# Patient Record
Sex: Male | Born: 1945 | Race: White | Hispanic: No | Marital: Married | Smoking: Former smoker
Health system: Southern US, Community
[De-identification: ages and names within clinical notes are randomized; demographics above are authoritative.]

## PROBLEM LIST (undated history)

## (undated) DIAGNOSIS — R519 Headache, unspecified: Secondary | ICD-10-CM

## (undated) DIAGNOSIS — I219 Acute myocardial infarction, unspecified: Secondary | ICD-10-CM

## (undated) DIAGNOSIS — I10 Essential (primary) hypertension: Secondary | ICD-10-CM

## (undated) DIAGNOSIS — R42 Dizziness and giddiness: Secondary | ICD-10-CM

## (undated) DIAGNOSIS — F4310 Post-traumatic stress disorder, unspecified: Secondary | ICD-10-CM

## (undated) DIAGNOSIS — Z77098 Contact with and (suspected) exposure to other hazardous, chiefly nonmedicinal, chemicals: Secondary | ICD-10-CM

## (undated) DIAGNOSIS — R51 Headache: Secondary | ICD-10-CM

## (undated) DIAGNOSIS — E785 Hyperlipidemia, unspecified: Secondary | ICD-10-CM

## (undated) DIAGNOSIS — I2510 Atherosclerotic heart disease of native coronary artery without angina pectoris: Secondary | ICD-10-CM

## (undated) HISTORY — PX: BACK SURGERY: SHX140

## (undated) HISTORY — PX: PACEMAKER INSERTION: SHX728

## (undated) HISTORY — PX: KNEE SURGERY: SHX244

## (undated) HISTORY — PX: CAROTID STENT: SHX1301

---
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
## 2001-01-02 HISTORY — PX: BACK SURGERY: SHX140

## 2001-12-28 ENCOUNTER — Emergency Department (HOSPITAL_COMMUNITY): Admission: EM | Admit: 2001-12-28 | Discharge: 2001-12-29 | Payer: Self-pay | Admitting: Emergency Medicine

## 2002-02-27 ENCOUNTER — Encounter: Payer: Self-pay | Admitting: Emergency Medicine

## 2002-02-27 ENCOUNTER — Inpatient Hospital Stay (HOSPITAL_COMMUNITY): Admission: EM | Admit: 2002-02-27 | Discharge: 2002-03-04 | Payer: Self-pay | Admitting: Emergency Medicine

## 2002-03-25 ENCOUNTER — Inpatient Hospital Stay (HOSPITAL_COMMUNITY): Admission: AD | Admit: 2002-03-25 | Discharge: 2002-03-27 | Payer: Self-pay | Admitting: Internal Medicine

## 2003-08-09 ENCOUNTER — Encounter: Payer: Self-pay | Admitting: Emergency Medicine

## 2003-08-09 ENCOUNTER — Inpatient Hospital Stay (HOSPITAL_COMMUNITY): Admission: AD | Admit: 2003-08-09 | Discharge: 2003-08-11 | Payer: Self-pay | Admitting: Cardiology

## 2004-01-03 DIAGNOSIS — F1910 Other psychoactive substance abuse, uncomplicated: Secondary | ICD-10-CM

## 2004-01-03 HISTORY — DX: Other psychoactive substance abuse, uncomplicated: F19.10

## 2005-02-13 ENCOUNTER — Ambulatory Visit: Payer: Self-pay | Admitting: *Deleted

## 2006-02-06 ENCOUNTER — Emergency Department (HOSPITAL_COMMUNITY): Admission: EM | Admit: 2006-02-06 | Discharge: 2006-02-06 | Payer: Self-pay | Admitting: Emergency Medicine

## 2007-01-03 DIAGNOSIS — I219 Acute myocardial infarction, unspecified: Secondary | ICD-10-CM

## 2007-01-03 HISTORY — PX: CAROTID STENT: SHX1301

## 2007-01-03 HISTORY — DX: Acute myocardial infarction, unspecified: I21.9

## 2007-02-25 ENCOUNTER — Emergency Department (HOSPITAL_COMMUNITY): Admission: EM | Admit: 2007-02-25 | Discharge: 2007-02-25 | Payer: Self-pay | Admitting: Emergency Medicine

## 2007-02-28 ENCOUNTER — Ambulatory Visit: Payer: Self-pay | Admitting: Internal Medicine

## 2007-03-06 ENCOUNTER — Ambulatory Visit: Payer: Self-pay | Admitting: Cardiology

## 2007-03-06 ENCOUNTER — Encounter (HOSPITAL_COMMUNITY): Admission: RE | Admit: 2007-03-06 | Discharge: 2007-04-05 | Payer: Self-pay | Admitting: Internal Medicine

## 2007-03-08 ENCOUNTER — Ambulatory Visit: Payer: Self-pay | Admitting: Cardiology

## 2007-03-11 ENCOUNTER — Ambulatory Visit: Payer: Self-pay | Admitting: Internal Medicine

## 2007-04-08 ENCOUNTER — Ambulatory Visit (HOSPITAL_COMMUNITY): Admission: RE | Admit: 2007-04-08 | Discharge: 2007-04-08 | Payer: Self-pay | Admitting: Internal Medicine

## 2007-04-08 ENCOUNTER — Ambulatory Visit: Payer: Self-pay | Admitting: Internal Medicine

## 2008-07-02 ENCOUNTER — Emergency Department (HOSPITAL_COMMUNITY): Admission: EM | Admit: 2008-07-02 | Discharge: 2008-07-02 | Payer: Self-pay | Admitting: Emergency Medicine

## 2009-09-09 ENCOUNTER — Emergency Department (HOSPITAL_COMMUNITY): Admission: EM | Admit: 2009-09-09 | Discharge: 2009-09-09 | Payer: Self-pay | Admitting: Emergency Medicine

## 2010-01-02 HISTORY — PX: KNEE SURGERY: SHX244

## 2010-03-17 LAB — DIFFERENTIAL
Basophils Absolute: 0 10*3/uL (ref 0.0–0.1)
Basophils Relative: 1 % (ref 0–1)
Eosinophils Relative: 2 % (ref 0–5)
Monocytes Absolute: 0.6 10*3/uL (ref 0.1–1.0)
Monocytes Relative: 11 % (ref 3–12)
Neutro Abs: 3.1 10*3/uL (ref 1.7–7.7)

## 2010-03-17 LAB — POCT CARDIAC MARKERS
CKMB, poc: <1 ng/mL — ABNORMAL LOW (ref 1.0–8.0)
Troponin i, poc: <0.05 ng/mL (ref 0.00–0.09)

## 2010-03-17 LAB — CBC
HCT: 40.5 % (ref 39.0–52.0)
Hemoglobin: 13.8 g/dL (ref 13.0–17.0)
MCHC: 34.1 g/dL (ref 30.0–36.0)
MCV: 95.1 fL (ref 78.0–100.0)
RDW: 13.5 % (ref 11.5–15.5)

## 2010-04-10 LAB — BASIC METABOLIC PANEL
BUN: 10 mg/dL (ref 6–23)
Chloride: 106 mEq/L (ref 96–112)
GFR calc non Af Amer: >60 mL/min (ref >60)
Glucose, Bld: 172 mg/dL — ABNORMAL HIGH (ref 70–99)
Potassium: 4.3 mEq/L (ref 3.5–5.1)
Sodium: 137 mEq/L (ref 135–145)

## 2010-04-10 LAB — CBC
HCT: 40.5 % (ref 39.0–52.0)
Hemoglobin: 14.5 g/dL (ref 13.0–17.0)
MCV: 92.8 fL (ref 78.0–100.0)
WBC: 6.8 10*3/uL (ref 4.0–10.5)

## 2010-04-10 LAB — DIFFERENTIAL
Eosinophils Absolute: 0.1 10*3/uL (ref 0.0–0.7)
Eosinophils Relative: 2 % (ref 0–5)
Lymphocytes Relative: 29 % (ref 12–46)
Lymphs Abs: 2 10*3/uL (ref 0.7–4.0)
Monocytes Absolute: 0.4 10*3/uL (ref 0.1–1.0)

## 2010-04-10 LAB — POCT CARDIAC MARKERS: Troponin i, poc: <0.05 ng/mL (ref 0.00–0.09)

## 2010-05-17 NOTE — Assessment & Plan Note (Signed)
[facility]                       [place] [facility] OFFICE NOTE   Marcus Patterson, Marcus Patterson                     MRN:          [id number]  DATE:03/11/2007                            DOB:          1945/08/17    IDENTIFICATION:  The patient is a 65 year old gentleman I saw back at  the end of February with known coronary artery disease.  He came in  saying he was just fatigued for a couple of weeks.   When I saw him last, he went ahead and scheduled a Myoview scan.  He  exercised for a total of 10 minutes and got his heart rate up to 142  which was 86% of predicted maximum.  Myoview scan showed normal  perfusion.  EF was normal.   In addition, the patient had a Holter monitor.  This showed sinus  rhythm, and rates between 41 and 126 beats per minute with an average  heart rate of 57, rare ectopic beat.   The patient says he still feels bad, just feels like he has no energy,  cannot do anything.  If he does something, it takes him a couple of days  to regroup.   CURRENT MEDICINES:  1. Simvastatin 20 mg.  2. Aspirin 81 mg.  3. Lisinopril held on last visit because his blood pressure was low.   Blood pressure today 128/90, pulse is 68, weight 189.  Lungs:  Clear.  CARDIAC:  Regular rate and rhythm, S1 and S2, no S3, no murmurs.  ABDOMEN:  Benign.  EXTREMITIES:  No edema.   IMPRESSION:  Fatigue.  I am not sure what this represents.  Question  some depression.  I would schedule him for a cardiopulmonary stress test  to further evaluate this.   I will increase his lisinopril to half a tablet 20 mg daily given his  blood pressure.  Continue his other medicines.  I will be in touch with  him once I have seen the results.  For now, maintain on disability.   I am not convinced there is any significant bradycardia to explain his  symptoms.  Again, he was able to increase his heart rate to over 140 on  the treadmill.     Pricilla Riffle, MD, [facility]  Electronically Signed    PVR/MedQ  DD: 03/11/2007  DT: 03/12/2007  Job #: [id number]

## 2010-05-17 NOTE — Assessment & Plan Note (Signed)
[facility]                       [place] CARDIOLOGY OFFICE NOTE   Marcus, Patterson                     MRN:          [id number]  DATE:02/28/2007                            DOB:          November 02, 1945    IDENTIFICATION:  Marcus Patterson is a 65 year old gentleman who has been  seen by our group in the past.  He has never had a clinic follow-up.  The patient has a history of coronary artery disease status post  PTCA/stent (CYPHER) to the proximal left circumflex back in 2004.  Last  cardiac catheterization in August 2005 showed only a 30% LAD lesion 50%  D1 lesion, EF of 60%.  The patient was involved in the [facility]  trial.  The patient since has a primary physician in [place], also  followed by the [place] in [place].   He comes in today, he has been seen actually in the emergency room in  early February with pleuritic chest pain and then on February 23 just  feeling weak.   On talking to him today, he says he has been fatigued, particularly over  the last couple weeks.  No dizziness, no syncope.  No cough.  Says he  just feels like he cannot move.  He will go to work and then he will be  out for 2 days.  It actually has been going on for few months, but the  past 2 weeks have been really bad.  Denies any associated chest pains  with this.  Note, though during our interaction, he did develop some  chest pain that was sharp and radiating across his head.  Denies reflux,  not like is prior anginal pain.   ALLERGIES:  None.   CURRENT MEDICATIONS:  Include citalopram 20, lisinopril 40, simvastatin  20, aspirin 81 mg daily.   PAST MEDICAL HISTORY:  CAD as noted.  Hypertension.  History of tobacco  use.  History of alcohol use.  History of dyslipidemia.   SOCIAL HISTORY:  The patient works as a Curator.  Remote tobacco.  No  alcohol.   FAMILY HISTORY:  Negative for CAD.   REVIEW OF SYSTEMS:  Denies fevers otherwise all systems reviewed  negative to the above problem except as noted above.   PHYSICAL EXAM:  The patient is in no distress.  Blood pressure initially 100/80, orthostatic blood pressure 100/60,  pulse 60, sitting 98/60, pulse 60, standing at 0 minutes 90/60, pulse  56, slightly dizzy, 2 minutes 90/62, pulse 62 slightly dizzy, 5 minutes  92/60, pulse 60 slightly dizzy and weak.  HEENT:  Normocephalic, atraumatic, EOMI, PERRLA.  Mucous membranes  moist.  NECK:  JVP is normal without thyromegaly or bruits.  Lungs are clear to auscultation without rales or wheezes.  Cardiac exam regular rate and rhythm, S1-S2 no S3 no murmurs.  ABDOMEN:  Supple, nontender, no masses.  No hepatomegaly.  EXTREMITIES:  Good distal pulses throughout.  No lower extremity edema.   12-lead EKG shows sinus bradycardia at 49 beats per minute.   Labs from the emergency room show normal BMET.  Troponin was negative.  TSH normal.  Hemoglobin normal.  IMPRESSION:  Marcus Patterson is a gentleman with history of coronary artery  disease now with chief complaint of weakness.  He did have some chest  pain here in the office.  I am not convinced it is cardiac.  EKG is  negative and is atypical.  He has a little bit anxious today.   What I have recommended today is that we set them up for a stress  Myoview.  Also set up for a Holter monitor with his resting bradycardia.  I would stop the lisinopril given his lower blood pressure and  complaints of weakness.  I would like to run him a little high for now.  Will check liver function, ESR, cortisol.  He is given a prescription  for nitroglycerin p.r.n. and will follow up in clinic in a couple of  weeks.     Pricilla Riffle, MD, [facility]  Electronically Signed    PVR/MedQ  DD: 02/28/2007  DT: 03/01/2007  Job #: [id number]

## 2010-05-20 NOTE — Discharge Summary (Signed)
NAME:  CAYDAN, MCTAVISH                        ACCOUNT NO.:  [id number]   MEDICAL RECORD NO.:  [id number]                   PATIENT TYPE:  INP   LOCATION:  6525                                 FACILITY:  [facility]   PHYSICIAN:  Learta Codding, M.D. [facility]             DATE OF BIRTH:  February 24, 1945   DATE OF ADMISSION:  02/28/2002  DATE OF DISCHARGE:                                 DISCHARGE SUMMARY   HISTORY:  The patient is a 65 year old white male presented to [facility] with chest discomfort radiating to his jaw associated with  diaphoresis and shortness of breath relieved with sublingual nitroglycerin  in the emergency room.  He was admitted to [facility] by Dr.  Letitia Libra and ruled in for a non-Q wave myocardial infarction.  His history  is notable for hypertension and tobacco use.  He had a stress test  approximately 20 years ago but he does not remember the results.  History of  knee and back surgery.   LABORATORY DATA:  Admission H&H to [facility] was 15.4 and 44.7, normal  indices, platelets 200, WBC 7.9.  Sodium 137, potassium 5, BUN 13,  creatinine 0.9, glucose 107.  Normal LFT's.  Initial CK was 182 with MB of  4.6, relative index of 2.5 and a troponin of 0.9.  Second CK was elevated at  162 with MB 11, relative index 6.8, and a troponin of 0.86.  On transfer on  March 1, postprocedure H&H of 15.3 and 43.5, normal indices.  Sodium 134,  potassium 4.1, BUN 13, creatinine 0.9, glucose 115.  Postprocedure CK-MB was  negative at 57 and 1.7.  BNP at [facility] was less than 30.  Lipid panel  performed at [facility] was within normal limits except HDL was low.   EKG showed normal sinus rhythm, nonspecific ST-T wave changes, and sinus  bradycardia.   HOSPITAL COURSE:  The patient was transferred to [facility] facility to  undergo cardiac catheterization.  He was transferred on Integrilin. Lovenox,  aspirin, Lopressor, and Captopril.  On February 29, it was noted that  he did  have an episode of epistaxis but Dr. Daleen Squibb felt that we did not need to  change his anticoagulation.  Cardiac catheterization was performed on March  1 by Dr. Juanda Chance.  This revealed a 50% diagonal #1, a 90% proximal  circumflex, irregularities in the RCA.  His EF was 60%.  Dr. Juanda Chance  performed a Cypher stenting to the circumflex, reducing the lesion from 90%  to 0%.  Postsheath removal on bedrest, the patient was ambulating without  difficulty.  Catheterization site was intact.  Dr. Juanda Chance noted that he  should be on Plavix at least three months to one year.  On March 2, after  review, Dr. Riley Kill felt that he could be discharged home.   DISCHARGE DIAGNOSES:  1. Non-Q wave myocardial infarction, transfer from [facility],     status post angioplasty stenting to the circumflex as previously     described.  2. Residual nonobstructive coronary artery disease as previously described.  3. Hypertension.  4. Asymptomatic sinus bradycardia.  5. Hyperlipidemia.  6. Epistaxis.  7. Tobacco and alcohol use.   DISPOSITION:  He is discharged home.   DISCHARGE MEDICATIONS:  1. Plavix 75 mg daily for at least three months.  2. Coated aspirin 325 mg daily.  3. Sublingual nitroglycerin as needed.  4. Toprol XL 25 mg daily.  5. Crestor 10 mg q.h.s.   ACTIVITY:  He was advised no lifting, driving, sexual activity, or heavy  exertion until seen by the physician.   DIET:  Maintain low-salt/fat/cholesterol diet.   DISCHARGE INSTRUCTIONS:  If he has any problems with his catheterization  site, he was asked to call.  He was advised no smoking or tobacco products  and no more than two beers per day.   FOLLOW UP:  He will see Dr. Virl Son. on Tuesday, March 16 at 11:45 a.m.  It is noted prior to discharge cardiac rehab and smoking cessation saw him  in consultation.  At the time of his followup visit in six weeks, fasting  lipids and LFT's will need to be arranged since Crestor  was initiated.  Also, encouragement on continued risk factor modification.  Also, the  duration of the Plavix will need to be determined.     Joellyn Rued, P.A. [facility]                    Learta Codding, M.D. [facility]    EW/MEDQ  D:  03/04/2002  T:  03/04/2002  Job:  [id number]   cc:   Gracelyn Nurse, M.D.  [address]  [place] [address]  Fax: [phone]   Vida Roller, M.D.  Fax: [phone]

## 2010-05-20 NOTE — Cardiovascular Report (Signed)
NAME:  Marcus Patterson, Marcus Patterson                        ACCOUNT NO.:  [id number]   MEDICAL RECORD NO.:  [id number]                   PATIENT TYPE:  INP   LOCATION:  3711                                 FACILITY:  [facility]   PHYSICIAN:  Charlies Constable, M.D. [facility]              DATE OF BIRTH:  February 27, 1945   DATE OF PROCEDURE:  DATE OF DISCHARGE:  08/11/2003                              CARDIAC CATHETERIZATION   HISTORY:  Marcus Patterson is 65 years old, and has documented coronary disease  with previous stent placed to the circumflex artery in March of 2004.  He  recently had substernal chest pain and was hospitalized and scheduled for  evaluation with angiography.  He is a smoker and has a high triglycerides  and low HDL.   PROCEDURE:  The procedure was performed by the right femoral artery using an  arterial sheath and a 6-French preformed coronary catheters.  A front-wall  arterial punch was performed, and Omnipaque contrast was used.   The patient was enrolled in the Stradivarius.  After completion of the  diagnostic study, we performed intravascular ultrasound on the left anterior  descending artery.  The patient was given weight-adjusted heparin and put on  __________  200 seconds.  We used a Q46 __________  with side holes and a  soft Asahi wire.  We passed the wire down the LAD without difficulty.  We  passed an Atlantis ultrasound catheter down to the mid LAD just to about the  level of the septal perforator, and then did automatic pullback.  Nitroglycerin was given prior to the placement of the IVUS.  The right  femoral artery was closed with Angio-Seal at the end of the procedure.  The  patient tolerated the procedure well, and left the laboratory in  satisfactory condition.   RESULTS:  1. The aortic pressure was 123/76 with a mean of 95, and left ventricular     pressure was 123/13.  2. LEFT MAIN CORONARY ARTERY:  The left main coronary artery is free of     significant disease.  3. LEFT  ANTERIOR DESCENDING ARTERY:  The left anterior descending artery     gave rise to a diagonal branch and a septal perforator.  The LAD was     irregular.  There was 30% narrowing proximally before the diagonal     branch, and there was 50% ostial stenosis in the diagonal branch.  4. CIRCUMFLEX ARTERY:  The circumflex artery was a dominant vessel that gave     rise to a ramus branch, a marginal branch, a posterolateral branch and a     posterior descending branch.  There was 0% stenosis at the stent site in     the proximal circumflex artery.  There were irregularities in the mid     circumflex artery and in the marginal branch.  There was 40% narrowing in     the distal circumflex artery.  5. RIGHT CORONARY ARTERY:  The right coronary artery is a nondominant vessel     that supplied only right ventricular branches.  These vessels are free of     significant disease.   LEFT VENTRICULOGRAM:  Left ventriculogram performed in the RAO projection  showed good wall motion with no areas of hypokinesis. The estimated ejection  fraction was 60%.   CONCLUSION:  1. Coronary artery disease, status post prior stenting of the circumflex     artery with nonobstructive coronary disease with 0% stenosis at the stent     site, in the circumflex artery and 40% narrowing in the distal circumflex     artery, 30% narrowing in the proximal LAD with 50% narrowing in the     diagonal branch, and no significant obstruction in non-dominant right     coronary artery, and normal LV function.  2. IVUS of the left anterior descending artery as part of the Stradivarius     trial.   RECOMMENDATIONS:  1. We will plan reassurance.  2. We will give the patient a trial of proton pump inhibitors.  Since the     patient only had one episode of chest pain, we will not plan on any     further treatment unless he has recurrence.  3. We will arrange follow up with Dr. Dorethea Clan, and our research nurses will     follow up  regarding the Stradivarius trial after his IVUS has been     reviewed at the [facility].  If his IVUS qualifies, he will be     randomized to rimonabant versus placebo, and followed for two years with     a follow up catheterization and IVUS at two years.                                               Charlies Constable, M.D. [facility]    BB/MEDQ  D:  08/11/2003  T:  08/12/2003  Job:  [id number]   cc:   Vida Roller, M.D.  Fax: [phone]   Cardiopulmonary Lab

## 2010-05-20 NOTE — Consult Note (Signed)
NAME:  Marcus Patterson, Marcus Patterson                        ACCOUNT NO.:  [id number]   MEDICAL RECORD NO.:  [id number]                   PATIENT TYPE:  INP   LOCATION:  A211                                 FACILITY:  [facility]   PHYSICIAN:  Hanley Hays. Dechurch, M.D.           DATE OF BIRTH:  Apr 27, 1945   DATE OF CONSULTATION:  DATE OF DISCHARGE:  03/27/2002                                   CONSULTATION   DIAGNOSES:  1. Fatigue possibly related to beta blocker.  2. Coronary artery disease status post fiber stent 03/03/02 with residual     nonobstructive disease.  3. Hypertensive response to exercise.  4. Sinus bradycardia.   DISPOSITION:  The patient is discharged to home and will follow up with  Cardiology in one week.   HOSPITAL COURSE:  A 65 year old Caucasian male status post stenting of his  right coronary artery 03/03/02 after he presented with chest and neck pain  associated with flushing.  He subsequently developed positive enzymes after  being admitted and was transferred to [facility].  He did very well post  catheterization.  He was placed on Crestor, Toprol, aspirin and Plavix.  He  presented to the Cardiology clinic on the day of admission complaining of  general malaise and feeling hot, flushing, and reportedly a rash.  The  patient arrived to the floor and was seen about one hour after that.  He had  no evidence of rash, flushing, urticaria or anything that could be deemed as  an exanthem.  The patient's laboratory data was unremarkable.  He gave a  history of almost near syncope and complained of flushing similar to what he  felt just prior to developing his chest pain.  Because of that, he was  admitted to telemetry.  He was not orthostatic.  He remained bradycardic  even off the Toprol.  He underwent Cardiolite stress testing and apparently  performed well.  He did have a hypertensive response to exercise and some  ectopy, but no acute ischemic changes.  Images are pending at the  time of  dictation, but the patient has been feeling much, much better off the  previous medications.  He was advised to continue his aspirin and Plavix.  He will resume his Crestor this evening, and if he is tolerating that then  consider adding a titrating dose of beta blocker.  Again, if he does not  tolerate this, then I will defer the change to his cardiologist.  The  patient's blood pressure in the hospital was actually well managed.  He did  have one blood pressure documented 170/93, but this was not repeated.  Again, this will need to be monitored as an outpatient.  The plan was  discussed with the patient.  At the time of discharge, he is alert and  oriented.  Stable blood pressures 128/70, pulse 60 and regular, respirations  unlabored.  Lungs clear.  Heart regular, no  murmur or gallop.  Abdomen is  obese, soft and nontender.  Extremities without cyanosis, clubbing or edema.  Neurologically intact.  Skin is without rash, lesion or breakdown.   PLAN:  As noted above.   MEDICATIONS:  1. Crestor 10 mg daily.  2. Aspirin 325 daily.  3. Plavix 75 mg daily.  4. Toprol XL 25 mg one half tablet daily to begin in a.m.    FOLLOWUP:  Again, if he develops similar symptoms, then he will consult  Cardiology.  The patient was advised to secure follow up with a primary care  physician.  Apparently, he will be seeking that through the [place].                                               Hanley Hays Josefine Class, M.D.    FED/MEDQ  D:  03/27/2002  T:  03/28/2002  Job:  [id number]

## 2010-05-20 NOTE — H&P (Signed)
NAME:  Marcus Patterson, Marcus Patterson                        ACCOUNT NO.:  [id number]   MEDICAL RECORD NO.:  [id number]                   PATIENT TYPE:  INP   LOCATION:  A211                                 FACILITY:  [facility]   PHYSICIAN:  Hanley Hays. Dechurch, M.D.           DATE OF BIRTH:  1945-08-06   DATE OF ADMISSION:  03/25/2002  DATE OF DISCHARGE:                                HISTORY & PHYSICAL   HISTORY OF PRESENT ILLNESS:  The patient is a 65 year old Caucasian male  followed by [facility] with known coronary artery disease status  post non-Q MI 02/27/2002 and subsequent CYPHER stent postcatheterization for  a 90% circumflex lesion on 03/03/2002.  He had residual nonobstructive  disease.  His hospital stay was uncomplicated.  He was started on Toprol,  Crestor and Plavix while in the hospital and discharged to home in early  March.  Since discharge he has complained of fatigue falls asleep sitting  down which is quite unusual for him and is confirmed by his family however,  he has been active and engaging in lifestyle changes enthusiastically over  this period of time completing his cardiac rehab, dietary changes, has not  smoked or drank since discharge.  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  The patient noted while he was doing his work and standing he  had an episode of flushing and an empty feeling which he could not further  characterize, it resolved after rest and a soft drink.  He had a similar  episode today but did not resolve while resting and because of generally  feeling poorly and sick he presented to the cardiology clinic where there  was concern that he had a petechial rash and diffuse erythematous skin which  had resolved by the time he reached the floor (not more than 1 hour later).  His labs including CBC, CMP, PT  and PTT thusfar are normal.  His EKG reveals  sinus bradycardia but no ischemic changes, rates in the 48 region.  The  patient noted that the flushing sensation had occurred previously when he  was standing and similar to the symptoms experienced just prior to his MI in  February.  The patient is being admitted to the hospital to further assess  his atypical symptoms and rule out potential ischemia.   MEDICAL HISTORY:  Non-Q MI 02/27/2002, cath as noted above, EF of 60% at  cath, residual nonobstructive disease status post stent of the circumflex,  status post left knee surgery remote, history of back surgery, history of  hypertension (no treatment).   MEDICATIONS:  Toprol 25 daily, aspirin 325 daily, Plavix 75 daily, and  Crestor 10 mg daily - all since March 2004.   ALLERGIES:  None known.  SOCIAL HISTORY:  He is a Barrister's clerk person.  He previously smoked  a pack per day and drank up to a six pack or more per day but quit as of his  recent hospital stay.  He is divorced.  He has two children.  He has a  significant other who accompanies him today.   FAMILY MEDICAL HISTORY:  Mother has died of leukemia, otherwise  unremarkable.   REVIEW OF SYSTEMS:  The patient has noted increased indigestion recently,  significant fatigue since discharge, no GU or other GI complaints.  He gets  winded with exertion and just plain give out.  He describes what sounds  like near syncope with flushing.  He sleeps all the time.  He has  difficulty getting out of bed in the morning, particularly since returning  to work which is unusual for him.  He has made attempts to change his diet  significantly and attempts to exercise daily.  He denies any chest pain or  jaw pain similar to what he had in the past.  He has noted that his feet  were blue and that he was feeling overly hot over the last several days  which is unusual for him.   PHYSICAL EXAMINATION:  GENERAL:  Alert and oriented, well  developed, well  nourished.  VITAL SIGNS:  Blood pressure lying is 135/70, sitting is 117/63 and upright  is 123/70; pulse is 60 and without change with position.  He complained of  feeling flushed during the evaluation.  NECK:  No bruits; no adenopathy; no thyromegaly.  LUNGS:  Clear to auscultation anterior and posteriorly.  HEART:  Bradycardic, regular, ranging in the 46-50 range during my exam; no  murmurs noted.  ABDOMEN:  Protuberant, soft, with bowel sounds, nontender.  EXTREMITIES:  Without clubbing, cyanosis, or edema; his pulses are intact;  there is no stasis noted, no edema.  NEUROLOGIC:  Fully intact.  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   ASSESSMENT AND PLAN:  Episode of flushing and weakness with question of near  syncope with generalized malaise since his return to work.  We need to  assess for the possibility of occlusion or ischemia though my suspicion is  quite low.  I suspect this is multifactorial, possibly related in part to  his Toprol.  Need to rule out hypothyroidism.  Cardiac enzymes will be  obtained as will a TSH and an EKG.  I doubt that this is related to his  Plavix or aspirin.  There is no eosinophilia, his platelet count is normal  and he had been on aspirin for quite some time.  Certainly some of his  gastrointestinal symptoms may be related to the aspirin and Plavix and we  have added proton pump inhibitor for gastrointestinal protection.  I am  going to hold his Toprol over the next 24 hours and see if his symptoms  improve and monitor his heart rate.  It may be that this gentleman just does  not tolerate this dose of beta blocker.  Certainly he could be exhibiting some anxiety/adjustment reaction post his recent procedure  and change in his  lifestyle.  This was discussed at length with the patient and the family.  This patient definitely needs  a primary care Mandeep Ferch, needs to be arranged  at the time of discharge as these things need to be taken care of on an  ongoing basis.  The plan was discussed with the patient.  We have attempted  to set up a Cardiolite for tomorrow, unfortunately technically that will be  impossible, hopefully we can do it the following days, we will discuss with  cardiology, particularly if the enzymes are negative we can do this as an  outpatient.                                               Hanley Hays Josefine Class, M.D.    FED/MEDQ  D:  03/25/2002  T:  03/25/2002  Job:  [id number]

## 2010-05-20 NOTE — Cardiovascular Report (Signed)
NAME:  Marcus Patterson, Marcus Patterson                        ACCOUNT NO.:  [id number]   MEDICAL RECORD NO.:  [id number]                   PATIENT TYPE:  INP   LOCATION:  6525                                 FACILITY:  [facility]   PHYSICIAN:  Charlies Constable, M.D. [facility]              DATE OF BIRTH:  [birth year]   DATE OF PROCEDURE:  03/03/2002  DATE OF DISCHARGE:                              CARDIAC CATHETERIZATION   PROCEDURE PERFORMED:  Cardiac catheterization and percutaneous coronary  intervention.   CLINICAL HISTORY:  The patient is 65 years old and was recently admitted to  [facility] with chest pain and positive enzymes consistent with a  non-ST segment elevation infarction.  He was transferred to [facility]  and scheduled for catheterization today.   DESCRIPTION OF PROCEDURE:  The procedure was performed via the right femoral  artery using an arterial sheath and 6 French preformed coronary catheters.  A front wall arterial puncture was performed and Omnipaque contrast was  used. After completion of the diagnostic study, we made a decision to do  intervention on the circumflex artery.   The patient was on an Integrilin drip and was given heparin to prolong an  ACT greater than 200 seconds.  He was also given 200 mg of Plavix.  We used  a 4.0 Voda, a guiding catheter, 6 [place] with side holes, and a short luge  wire.  We crossed the lesion in the proximal circumflex artery with the wire  without difficulty.  We pre-dilated with a 3.5 x 15 mm Quantum Maverick  performing two inflations up to 6 atmospheres for 30 seconds.  We then  deployed at a 3.0 x 18 mm Cypher stent deploying this with one inflation of  16 atmospheres for 30 seconds.  We then post-dilated in the midportion of  the vessel with a 3.5 x 15 mm Quantum Maverick avoiding the edges and  inflating this to 12 atmospheres for 30 seconds.  Repeat diagnostic studies  were then performed through the guiding catheter.  The patient  tolerated the  procedure well and left the laboratory in satisfactory condition.   RESULTS:  1. The left main coronary artery:  The left main coronary artery was free of     significant disease.  2. Left anterior descending:  The left anterior descending artery gave rise     to a large diagonal branch and a septal perforator.  There was 50% ostial     narrowing in the diagonal branch.  There was no other major obstruction.  3. Circumflex artery:  The circumflex artery gave rise to an intermediate     branch, an atrial branch, a marginal branch, a posterolateral, and     posterior descending branch.  There was 90% narrowing in the proximal to     midportion of the vessel.  4. Right coronary artery:  The right coronary is a nondominant vessel that  supplies only right ventricle branches.  This vessel was irregular, but     had no major obstruction.   LEFT VENTRICULOGRAM:  The left ventriculogram performed in the RAO  projection showed good wall motion with no areas of hypokinesis.  The  estimated ejection fraction was 60%.   Following stenting of the lesion in the proximal circumflex artery, the  stenosis improved from 90% to 0%.  The flow remained TIMI-3 throughout.   CONCLUSIONS:  1. Coronary artery disease with 50% narrowing in the first diagonal branch     of the left anterior descending, 90% narrowing in the proximal circumflex     artery, mild irregularity in a nondominant right coronary artery and     normal left ventricular function.  2. Successful stenting of the lesion in the circumflex artery with     improvement in percent diameter narrowing from 90% to 0% using a Cypher     stent.   DISPOSITION:  The patient was returned to the postangioplasty unit for  further observation.                                                    Charlies Constable, M.D. [facility]    BB/MEDQ  D:  03/03/2002  T:  03/03/2002  Job:  [id number]   cc:   Gracelyn Nurse, M.D.  [address]  [place] [address]  Fax: [phone]   Vida Roller, M.D.  Fax: [phone]   Cardiopulmonary Lab

## 2010-05-20 NOTE — H&P (Signed)
NAME:  Marcus Patterson, Marcus Patterson                        ACCOUNT NO.:  [id number]   MEDICAL RECORD NO.:  [id number]                   PATIENT TYPE:  INP   LOCATION:  A210                                 FACILITY:  [facility]   PHYSICIAN:  Gracelyn Nurse, M.D.              DATE OF BIRTH:  August 14, 1945   DATE OF ADMISSION:  02/27/2002  DATE OF DISCHARGE:                                HISTORY & PHYSICAL   CHIEF COMPLAINT:  Chest pain.   HISTORY OF PRESENT ILLNESS:  This is a 65 year old white male with no  significant past medical history who presents with chest pain.  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  The pain did not radiate.  It was  not associated with any nausea or vomiting.  It went away here in the ER  with nitroglycerin.  He has never experienced any pain like this before in  the past.   PAST MEDICAL HISTORY:  1. Status post left knee surgery.  2. Status post back surgery.   CURRENT MEDICATIONS:  Aspirin 81 mg q.d.   SOCIAL HISTORY:  He smokes a pack of cigarettes a day, drinks 1 to 2 beers a  day.  He is divorced, has 2 children and he lives alone.   FAMILY HISTORY:  His mother died at age 92 of leukemia.  Father died at age  33 of lung cancer.   REVIEW OF SYSTEMS:  As per History of Present Illness.  Remainder of systems  were negative.   PHYSICAL EXAMINATION:  VITAL SIGNS:  Temperature is 98.2, pulse 70,  respirations 22, blood pressure 162/102.  GENERAL:  This is a well-nourished white male in no acute distress.  HEENT:  Pupils equal, round and reactive to light.  Equal ocular movement  intact.  Oral mucosa is moist.  Oropharynx is clear.  CARDIOVASCULAR:  Regular rate and rhythm.  No murmurs.  LUNGS:  Clear to auscultation.  ABDOMEN:  Soft, nontender, nondistended.  Bowel sounds positive.  EXTREMITIES:  No edema.  NEUROLOGIC:  Cranial nerves II through XII grossly intact.  No  focal  deficit.  SKIN:  Moist with no rash.   ADMISSION LABORATORIES:  EKG shows normal sinus rhythm.  White blood cells  7.9, hemoglobin 15.4, platelets 200.  Sodium 137, potassium 5.0, chloride  103, CO2 36, BUN 13, creatinine 0.9, glucose 107.  CK is 182, MB fraction  4.6, troponin-I is 0.09, d-Dimer is 0.34.    ASSESSMENT AND PLAN:  Problem #1.  Chest pain.  The patient certainly has some risk factors for  coronary artery disease.  He is a smoker.  He also has elevated blood  pressure, although he does not have a diagnosis of hypertension.  We will  start him on aspirin, beta blocker and Lovenox, give him nitroglycerin  p.r.n., admit  him for rule out.  If he does rule out, he will need stress  test.  Problem #2.  Elevated blood pressure.  This may be all anxiety or it could  be related to having an acute coronary syndrome.  We will monitor him for  now.  He is going to be on a beta blocker.  He may need to be treated for  hypertension if his blood pressure does not normalize.                                                 Gracelyn Nurse, M.D.    JDJ/MEDQ  D:  02/27/2002  T:  02/27/2002  Job:  [id number]

## 2010-05-20 NOTE — Discharge Summary (Signed)
NAME:  Marcus Patterson, Marcus Patterson                        ACCOUNT NO.:  [id number]   MEDICAL RECORD NO.:  [id number]                   PATIENT TYPE:  INP   LOCATION:  3711                                 FACILITY:  [facility]   PHYSICIAN:  Willa Rough, M.D.                  DATE OF BIRTH:  April 05, 1945   DATE OF ADMISSION:  08/09/2003  DATE OF DISCHARGE:  08/11/2003                                 DISCHARGE SUMMARY   DIAGNOSES:  1. Admitted with chest pain, electrocardiographic changes, nondiagnostic.     Troponin I studies are negative x3.  2. The patient had a CYPHER stent in the proximal left circumflex to reduce     a 90% lesion to 0 on March 03, 2002.  Ejection fraction at that time 60%.  3. Left heart catheterization August 11, 2003.  This stent is patent in the     proximal left circumflex.  The left anterior descending had 30% proximal     disease.  The first diagonal had 50% proximal disease.  The ejection     fraction was 60%.  Plan:  The patient is a candidate for Stradivarius.   SECONDARY DIAGNOSES:  1. History of coronary artery disease status post catheterization March     2004.  Status post CYPHER stent in the proximal left circumflex to reduce     a 90% lesion to 0.  2. Hypertension.  3. Ongoing tobacco.  4. Ethanol use.  5. Dyslipidemia with fasting lipid profile this admission.  Cholesterol 127,     triglyceride 179, HDL cholesterol 26, LDL cholesterol 65.  6. Status post knee surgery and back surgery.   BRIEF HISTORY:  The patient is a 65 year old male with known coronary artery  disease transferred from [facility] to [facility] for evaluation of  chest pain.  The patient had chest pain at 6:00 this morning on the morning  of August 7 while in bed watching T.V.  The patient has had occasional chest  pain since with mild relief with nitroglycerin.  Today's episode was much  worse.  The patient took seven nitroglycerin.  It radiated to the back and  lasted about five  hours, rated 6-8/10.  He was short of breath, diaphoresis.  No nausea.  Similar to previous heart pain, a tight and pulling sensation.  The patient will be admitted through the [facility] Emergency Room.  Lipid  profile will be checked.  He was stabilized on IV heparin, given aspirin,  and planned for nitroglycerin IV.  His rate is too slow so Toprol was held.  Plan catheterization on Tuesday, August 9.   HOSPITAL COURSE:  The patient admitted with chest pain through [facility]  Emergency Room.  Stabilized on IV nitroglycerin, IV heparin, aspirin.  Underwent left heart catheterization 48 hours later.  Chest pain free until  that time.  Study showed the coronary disease had not advanced.  The stent  in the proximal left circumflex was patent.  He did have mild residual  disease in the LAD.  He had ejection fraction preserved at 60%.  The patient  had no post procedure complications.  Ready for discharge the same day.  Goes home with the medications as follows.   DISCHARGE MEDICATIONS:  1. Lisinopril 20 mg every other day.  2. Zocor 20 mg at bedtime daily.  3. Enteric-coated aspirin 325 mg daily.   He is to avoid straining or heavy lifting for the next two weeks.  He is to  call [phone] if he has any trouble with his groin at the catheterization  site.  He is to follow up in the [place] office of [facility]  Tuesday, August 23 at 2:00 in the afternoon.      Maple Mirza, P.A.                    Willa Rough, M.D.    GM/MEDQ  D:  08/11/2003  T:  08/12/2003  Job:  [id number]   cc:   [facility] [place] [facility]

## 2010-05-20 NOTE — Discharge Summary (Signed)
   NAME:  Marcus Patterson, Marcus Patterson                        ACCOUNT NO.:  [id number]   MEDICAL RECORD NO.:  [id number]                   PATIENT TYPE:  INP   LOCATION:  A210                                 FACILITY:  [facility]   PHYSICIAN:  Gracelyn Nurse, M.D.              DATE OF BIRTH:  [birth year]   DATE OF ADMISSION:  02/27/2002  DATE OF DISCHARGE:  02/28/2002                                 DISCHARGE SUMMARY   DISCHARGE DIAGNOSES:  1. Non-Q-wave myocardial infarction.  2. Chest pain.  3. Elevated blood pressure.  4. Status post left knee surgery.  5. Status post back surgery.   DISCHARGE MEDICATIONS:  1. Aspirin 325 mg daily.  2. Lovenox 90 mg subcu q.12h.  3. Metoprolol 12.5 mg b.i.d.  4. Morphine 2 mg IV q.2h.  5. Nitroglycerin 0.4 mg sublingual p.r.n. chest pain.  6. Plavix 300 mg x1 dose, then 75 mg daily.   REASON FOR ADMISSION:  This is a 65 year old white male with no history of  coronary artery disease.  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  The pain did not radiate.  It was not associated with any nausea or  vomiting.  It was relieved in the ER with nitroglycerin.   HOSPITAL COURSE:  Problem 1.  NON-Q-WAVE MYOCARDIAL INFARCTION:  The patient was admitted on  telemetry, started on aspirin, Lovenox, and beta blocker, given  nitroglycerin p.r.n.  Early this morning his cardiac enzymes began to rise.  Troponin I was 0.86, CK was 162, with MB fraction of 11.  His admitting  troponin I was 0.09.  He was pain-free.  I contacted Learta Codding, M.D., of  [facility] and [facility], who wishes him to be transferred  there to the catheterization lab for cardiac catheterization.  At this point  will give him a dose of Plavix 300 mg and transfer him.   Problem 2.  ELEVATED BLOOD PRESSURE:  He does not have a history of  hypertension.  On admission his blood pressure was 162/102.   Currently he is  normotensive, but he has been started on a small dose of beta blocker.   DISPOSITION:  The patient will be transferred to [facility] for  cardiac catheterization.                                               Gracelyn Nurse, M.D.   JDJ/MEDQ  D:  02/28/2002  T:  02/28/2002  Job:  [id number]

## 2010-05-20 NOTE — Consult Note (Signed)
NAME:  Marcus Patterson, AGUINO NO.:  [id number]   MEDICAL RECORD NO.:  [id number]                   PATIENT TYPE:  INP   LOCATION:  A210                                 FACILITY:  [facility]   PHYSICIAN:  Vida Roller, M.D.                DATE OF BIRTH:  04-28-1945   DATE OF CONSULTATION:  02/28/2002  DATE OF DISCHARGE:                                   CONSULTATION   PRIMARY CARE PHYSICIAN:  [facility]  specifically Gracelyn Nurse, M.D.   REASON FOR CONSULTATION:  Chest discomfort.   HISTORY OF PRESENT ILLNESS:  This is a 65 year old gentleman with no  significant past medical history who has cardiac risk factors of tobacco  abuse and mild hypertension.  He presents with new onset chest discomfort  radiating to his jaw associated with diaphoresis and dyspnea.  No nausea or  vomiting.  The pain was relieved with a sublingual nitroglycerin in the  emergency department.  He was admitted with a diagnosis of unstable angina  and this was subsequently ruled in by myocardial infarction.   PAST MEDICAL HISTORY:  His past medical history is significant for a left  knee surgery and back surgery.  He had a stress test approximately 20 years  ago which he does not know the results of, but it was for discomfort in his  chest very atypical from the discomfort presently described.   SOCIAL HISTORY:  He lives alone.  He is divorced.  He has 2 children.  He  has a 40+ pack-year smoking history.  He drinks 1-2 beers a day. He does not  use any drugs.   FAMILY HISTORY:  Significant for his mother dying at age 59 of leukemia. His  father died at age 88 of lung cancer.   MEDICATIONS:  Aspirin 81 mg a day. He is currently on aspirin 325 mg a day  as well as Lovenox, Lopressor, nitroglycerin as he needs it.  He got a  single 600 mg loading dose of Plavix and he is currently on Integrilin  infusion.   PHYSICAL EXAMINATION:  GENERAL:  On physical  exam he is a well-developed,  well-nourished, white male in no apparent distress who is alert and oriented  x4.  His pulse is 56, respirations are 16.  His blood pressure was 134/70.  He has subsequently been evaluated at 158/78.  HEENT:  Examination of the head, eyes, ears, nose, and throat is  unremarkable.  NECK:  The neck is supple with no jugular venous distention or carotid  bruits. There is no lymphadenopathy noted.  CARDIAC:  His cardiac exam reveals a nondisplaced point of maximal impulse  with no lifts or thrills.  The first and second heart sounds are normal.  There is fourth heart sound, but no third heart sound and no murmurs are  noted.  LUNGS:  His lungs are clear  to auscultation bilaterally.  ABDOMEN:  Abdomen is soft, nontender, with normoactive bowel sounds.  No  hepatosplenomegaly is noted.  SKIN: Skin is without lesions.  GENITOURINARY AND RECTAL:  Exams were deferred.  EXTREMITIES:  Exam reveals no clubbing, cyanosis, or edema.  He has 2+  pulses throughout without bruits.  NEUROLOGIC:  Exam is nonfocal and there is no evidence of musculoskeletal  deformity.   LABORATORY DATA:  His electrocardiogram reveals a heart rate of 55 in sinus  rhythm with a normal access and normal intervals and no acute ST-T wave  changes or Q waves consistent with an old myocardial infarction.  Laboratory  exam:  White blood cell count is 7.9 with an H&H of 15 and 45.  Platelet  count of 200,000.  Sodium of 137, potassium of 5.0, chloride of 103,  bicarbonate of 36, BUN and creatinine are 13 and 0.9 with a blood sugar of  107.  Liver function studies are all within normal limits.  Three sets of  enzymes show CKs of 147, 162, and 182 with MB fractions of 4.6, 1.0, and  1.8.  Troponins have all been mildly elevated at 0.09 and subsequently 0.86  and 0.88.  D-dimer was negative and a B-type Naturade peptide of less than  30.   ASSESSMENT:  Chest discomfort typical for angina with  abnormal cardiac  enzymes concerning for a non-ST segment elevation myocardial infarction.  The pattern of the enzymes are not classic for a myocardial infarction;  however, the timing is such that we may not have caught the peak of the  troponins.   PLAN:  The plan is to continue to follow him clinically.  Make sure that he  has adequate anticoagulation, antiplatelet therapy and transport him to  [facility] for an invasive evaluation.  In the meantime we will add  low-dose of captopril to his beta blocker for his mild hypertension; and  hopefully transport him as soon as a bed is available.                                               Vida Roller, M.D.    JH/MEDQ  D:  02/28/2002  T:  02/28/2002  Job:  [id number]

## 2010-05-20 NOTE — H&P (Signed)
NAME:  Marcus Patterson, Marcus Patterson                        ACCOUNT NO.:  [id number]   MEDICAL RECORD NO.:  [id number]                   PATIENT TYPE:  INP   LOCATION:  3711                                 FACILITY:  [facility]   PHYSICIAN:  Willa Rough, M.D.                  DATE OF BIRTH:  10-Jul-1945   DATE OF ADMISSION:  08/09/2003  DATE OF DISCHARGE:                                HISTORY & PHYSICAL   CHIEF COMPLAINT:  Chest pain.   HISTORY OF PRESENT ILLNESS:  This is a 65 year old male with known coronary  artery disease who was transferred from [facility] Emergency Room  to [facility] today for evaluation of chest pain.  The patient is  followed in the [facility], I believe by Dr. Dorethea Clan.  The patient states he has chest pain approximately 8 a.m. this morning while  in bed watching television.  He has had occasional chest pain ever since he  had his MI in March of 2004.  Usually his pain is relieved by 1  nitroglycerine.  The pain this morning was worse in severity and duration.  He states he took a total of 7 nitroglycerine and the pain lasted  approximately 5 hours.  It was rated a 6 or an 8 on a scale of 1-10,  associated with shortness of breath, diaphoresis but no nausea.  He states  this similar to previous heart pain but worse.  He described the sensation  as a tight, pulling sensation radiating to his back.   PAST MEDICAL HISTORY:  The patient had a Cypher stent placed in the  circumflex artery in March of 2004 following a non Q-wave MI.  The lesion  was originally  90% and it was reduced to 0%.  He had some other  nonobstructive disease as well.  His ejection fraction was 60%.  He has a  history of hypertension.  He is status post knee surgery, he is status post  back surgery, he has a history of elevated lipids.   ALLERGIES:  No known drug allergies.   MEDICATIONS PRIOR TO ADMISSION:  Lisinopril 20 mg every other day, Zocor 20  mg at  bedtime, aspirin daily.   SOCIAL HISTORY:  The patient is divorced, he lives in [place], he has 2  grown children.  He works as a Optometrist.  He has a remote tobacco  history.  He had quit but he restarted again approximately 3 months ago, is  now smoking 1 pack per day.  He denies alcohol use.   FAMILY HISTORY:  His mother died from leukemia.  His father died from lung  cancer.  He is an only child.   REVIEW OF SYSTEMS:  Totally negative except for as noted above.  He has also  had some recent diaphoresis,   PHYSICAL EXAMINATION:  Physical examination reveals a pleasant 65 year old  white male  in no acute distress.  Vital signs:  Blood pressure 122/72, pulse  48, temperature 98.1.  HEENT:  Unremarkable.  NECK:  No bruits, no jugular venous distention.  HEART:  Regular rate and rhythm without murmur.  LUNGS:  Decreased breath sounds.  ABDOMEN:  Slightly obese, soft, nontender.  EXTREMITIES:  Pulse intact without significant edema.  SKIN:  Warm and dry.  NEUROLOGICAL EXAM:  Grossly intact.   ADMISSION LABORATORIES:  Chest x-ray showed atelectasis.  An EKG shows sinus  rhythm, rate 64 BPM with nonspecific changes.  His CBC was within normal limits. Point of care enzymes were negative x3.  BUN is 13, creatinine 0.9, potassium 4, glucose 147.   IMPRESSION:  1. Chest pain with negative point of care enzymes x3.  2. Non Q-wave MI and Cypher stent to the circumflex performed in March of     2004.  Ejection fraction 60 at time of catheterization.  3. Hypertension.  4. Hyperlipidemia.  5. Previous back and knee surgeries.  6. Bradycardia.  7. Elevated glucose.   PLAN:  The patient will undergo cardiac catheterization either on Monday or  Tuesday.  In the meantime we will treat him with IV heparin and IV  nitroglycerine.  We will also check a hemoglobin A1C due to his elevated  glucose level.  We will check fasting lipids in the a.m. as well.      Delton See, P.A. [facility]                   Willa Rough, M.D.    DR/MEDQ  D:  08/09/2003  T:  08/09/2003  Job:  [id number]   cc:   V A  [place], [place]

## 2010-05-25 ENCOUNTER — Emergency Department (HOSPITAL_COMMUNITY)
Admission: EM | Admit: 2010-05-25 | Discharge: 2010-05-26 | Disposition: A | Discharge status: Home / Self Care | Payer: Non-veteran care | Attending: Emergency Medicine | Admitting: Emergency Medicine

## 2010-05-25 DIAGNOSIS — R110 Nausea: Secondary | ICD-10-CM | POA: Insufficient documentation

## 2010-05-25 DIAGNOSIS — R55 Syncope and collapse: Secondary | ICD-10-CM | POA: Insufficient documentation

## 2010-05-25 DIAGNOSIS — Z950 Presence of cardiac pacemaker: Secondary | ICD-10-CM | POA: Insufficient documentation

## 2010-05-25 DIAGNOSIS — E119 Type 2 diabetes mellitus without complications: Secondary | ICD-10-CM | POA: Insufficient documentation

## 2010-05-25 DIAGNOSIS — R0989 Other specified symptoms and signs involving the circulatory and respiratory systems: Secondary | ICD-10-CM | POA: Insufficient documentation

## 2010-05-25 DIAGNOSIS — I10 Essential (primary) hypertension: Secondary | ICD-10-CM | POA: Insufficient documentation

## 2010-05-25 DIAGNOSIS — R0609 Other forms of dyspnea: Secondary | ICD-10-CM | POA: Insufficient documentation

## 2010-05-25 DIAGNOSIS — I252 Old myocardial infarction: Secondary | ICD-10-CM | POA: Insufficient documentation

## 2010-05-25 DIAGNOSIS — R42 Dizziness and giddiness: Secondary | ICD-10-CM | POA: Insufficient documentation

## 2010-05-25 LAB — DIFFERENTIAL
Basophils Absolute: 0 10*3/uL (ref 0.0–0.1)
Lymphocytes Relative: 28 % (ref 12–46)
Lymphs Abs: 1.6 10*3/uL (ref 0.7–4.0)
Neutro Abs: 3.6 10*3/uL (ref 1.7–7.7)
Neutrophils Relative %: 63 % (ref 43–77)

## 2010-05-25 LAB — CK TOTAL AND CKMB (NOT AT ARMC)
CK, MB: 2.1 ng/mL (ref 0.3–4.0)
Relative Index: 1.9 (ref 0.0–2.5)

## 2010-05-25 LAB — GLUCOSE, CAPILLARY: Glucose-Capillary: 102 mg/dL — ABNORMAL HIGH (ref 70–99)

## 2010-05-25 LAB — CBC
HCT: 38 % — ABNORMAL LOW (ref 39.0–52.0)
Hemoglobin: 13 g/dL (ref 13.0–17.0)
RBC: 4.14 MIL/uL — ABNORMAL LOW (ref 4.22–5.81)
RDW: 13.1 % (ref 11.5–15.5)
WBC: 5.7 10*3/uL (ref 4.0–10.5)

## 2010-05-25 LAB — BASIC METABOLIC PANEL
Calcium: 9.5 mg/dL (ref 8.4–10.5)
GFR calc non Af Amer: >60 mL/min (ref >60)
Glucose, Bld: 137 mg/dL — ABNORMAL HIGH (ref 70–99)
Sodium: 135 mEq/L (ref 135–145)

## 2010-05-25 LAB — TROPONIN I: Troponin I: <0.3 ng/mL (ref <0.30)

## 2010-05-26 LAB — URINALYSIS, ROUTINE W REFLEX MICROSCOPIC
Bilirubin Urine: NEGATIVE
Ketones, ur: NEGATIVE mg/dL
Nitrite: NEGATIVE
Specific Gravity, Urine: >1.03 — ABNORMAL HIGH (ref 1.005–1.030)
Urobilinogen, UA: 1 mg/dL (ref 0.0–1.0)

## 2010-09-23 LAB — BASIC METABOLIC PANEL
BUN: 16
Calcium: 8.6
GFR calc non Af Amer: >60
Glucose, Bld: 78
Sodium: 135

## 2010-09-23 LAB — DIFFERENTIAL
Basophils Absolute: 0
Lymphocytes Relative: 34
Neutro Abs: 3.9

## 2010-09-23 LAB — POCT CARDIAC MARKERS
Myoglobin, poc: 57.5
Operator id: 286941

## 2010-09-23 LAB — T4, FREE: Free T4: 1.1

## 2010-09-23 LAB — CBC
Platelets: 187
RDW: 14.2

## 2010-09-23 LAB — TSH: TSH: 2.585

## 2011-01-03 HISTORY — PX: PACEMAKER INSERTION: SHX728

## 2011-08-29 ENCOUNTER — Emergency Department (HOSPITAL_COMMUNITY)
Admission: EM | Admit: 2011-08-29 | Discharge: 2011-08-29 | Disposition: A | Discharge status: Home / Self Care | Payer: Non-veteran care | Attending: Emergency Medicine | Admitting: Emergency Medicine

## 2011-08-29 ENCOUNTER — Encounter (HOSPITAL_COMMUNITY): Payer: Self-pay | Admitting: *Deleted

## 2011-08-29 DIAGNOSIS — I10 Essential (primary) hypertension: Secondary | ICD-10-CM | POA: Insufficient documentation

## 2011-08-29 DIAGNOSIS — F4310 Post-traumatic stress disorder, unspecified: Secondary | ICD-10-CM | POA: Insufficient documentation

## 2011-08-29 DIAGNOSIS — Z87891 Personal history of nicotine dependence: Secondary | ICD-10-CM | POA: Insufficient documentation

## 2011-08-29 DIAGNOSIS — E119 Type 2 diabetes mellitus without complications: Secondary | ICD-10-CM | POA: Insufficient documentation

## 2011-08-29 DIAGNOSIS — M62838 Other muscle spasm: Secondary | ICD-10-CM

## 2011-08-29 DIAGNOSIS — Z950 Presence of cardiac pacemaker: Secondary | ICD-10-CM | POA: Insufficient documentation

## 2011-08-29 DIAGNOSIS — I2510 Atherosclerotic heart disease of native coronary artery without angina pectoris: Secondary | ICD-10-CM | POA: Insufficient documentation

## 2011-08-29 HISTORY — DX: Contact with and (suspected) exposure to other hazardous, chiefly nonmedicinal, chemicals: Z77.098

## 2011-08-29 HISTORY — DX: Essential (primary) hypertension: I10

## 2011-08-29 HISTORY — DX: Hyperlipidemia, unspecified: E78.5

## 2011-08-29 HISTORY — DX: Post-traumatic stress disorder, unspecified: F43.10

## 2011-08-29 HISTORY — DX: Atherosclerotic heart disease of native coronary artery without angina pectoris: I25.10

## 2011-08-29 MED ORDER — HYDROMORPHONE HCL PF 2 MG/ML IJ SOLN
2.0000 mg | Freq: Once | INTRAMUSCULAR | Status: AC
  Administered 2011-08-29: 2 mg via INTRAMUSCULAR
  Filled 2011-08-29: qty 1

## 2011-08-29 MED ORDER — SODIUM CHLORIDE 0.9 % IV SOLN: 1000.0000 mL | INTRAVENOUS | Status: DC

## 2011-08-29 MED ORDER — DIAZEPAM 5 MG PO TABS: 5.0000 mg | ORAL_TABLET | Freq: Four times a day (QID) | ORAL | Status: AC | PRN

## 2011-08-29 MED ORDER — KETOROLAC TROMETHAMINE 30 MG/ML IJ SOLN
30.0000 mg | Freq: Once | INTRAMUSCULAR | Status: AC
  Administered 2011-08-29: 30 mg via INTRAVENOUS
  Filled 2011-08-29: qty 1

## 2011-08-29 MED ORDER — OXYCODONE-ACETAMINOPHEN 5-325 MG PO TABS: 1.0000 | ORAL_TABLET | ORAL | Status: AC | PRN

## 2011-08-29 MED ORDER — DIAZEPAM 5 MG PO TABS
5.0000 mg | ORAL_TABLET | Freq: Once | ORAL | Status: AC
  Administered 2011-08-29: 5 mg via ORAL
  Filled 2011-08-29: qty 1

## 2011-08-29 MED ORDER — ONDANSETRON HCL 4 MG/2ML IJ SOLN
4.0000 mg | Freq: Once | INTRAMUSCULAR | Status: AC
  Administered 2011-08-29: 4 mg via INTRAVENOUS
  Filled 2011-08-29: qty 2

## 2011-08-29 MED ORDER — MORPHINE SULFATE 2 MG/ML IJ SOLN
INTRAMUSCULAR | Status: AC
  Administered 2011-08-29: 4 mg via INTRAVENOUS
  Filled 2011-08-29: qty 2

## 2011-08-29 MED ORDER — MORPHINE SULFATE 4 MG/ML IJ SOLN: 4.0000 mg | Freq: Once | INTRAMUSCULAR | Status: AC

## 2011-08-29 MED ORDER — SODIUM CHLORIDE 0.9 % IV SOLN
1000.0000 mL | Freq: Once | INTRAVENOUS | Status: AC
  Administered 2011-08-29: 1000 mL via INTRAVENOUS

## 2011-08-29 NOTE — ED Notes (Signed)
Pt stable. Pt ambulatory with steady gait at discharge. Pt instructed not to take extra tylenol with pain med also instructed not to drive Pt verbalizes understanding

## 2011-08-29 NOTE — ED Provider Notes (Signed)
History     CSN: [id number]  Arrival date & time 08/29/11  9528   First MD Initiated Contact with Patient 08/29/11 [phone]      Chief Complaint  Patient presents with  . Shoulder Pain    (Consider location/radiation/quality/duration/timing/severity/associated sxs/prior treatment) The history is provided by the patient.   patient reports severe pain in his left shoulder it is worsened by palpation of his left trapezius muscle and movement of his head to the left or right upper down or movement of his left arm.  He reports his pain is been constant.  His pain began yesterday and worsened through the evening and night.  He denies weakness or numbness of his upper extremities.  He denies abdominal pain.  He reports all movement seems to worsen his pain.  His chin is tucked under his chest he reports that if he looks up causes severe pain.  The patient is sitting in the bed with his left shoulder nearly touching his ear given severe guarding and spasm  Past Medical History  Diagnosis Date  . Diabetes mellitus   . Hypertension   . Hyperlipidemia   . Agent orange exposure   . Coronary artery disease   . PTSD (post-traumatic stress disorder)     Past Surgical History  Procedure Date  . Pacemaker insertion   . Back surgery   . Carotid stent   . Knee surgery     No family history on file.  History  Substance Use Topics  . Smoking status: Former Games developer  . Smokeless tobacco: Not on file  . Alcohol Use: No      Review of Systems  All other systems reviewed and are negative.    Allergies  Review of patient's allergies indicates no known allergies.  Home Medications  No current outpatient prescriptions on file.  BP 141/87  Pulse 77  Temp 98.4 F (36.9 C) (Oral)  Resp 18  Ht 5' 8.5" (1.74 m)  Wt 196 lb (88.905 kg)  BMI 29.37 kg/m2  SpO2 95%  Physical Exam  Nursing note and vitals reviewed. Constitutional: He is oriented to person, place, and time. He appears  well-developed and well-nourished.       Uncomfortable appearing  HENT:  Head: Normocephalic and atraumatic.  Eyes: EOM are normal.  Neck: Normal range of motion.  Cardiovascular: Normal rate, regular rhythm, normal heart sounds and intact distal pulses.   Pulmonary/Chest: Effort normal and breath sounds normal. No respiratory distress.  Abdominal: Soft. He exhibits no distension. There is no tenderness.  Musculoskeletal:       The patient's left shoulder is completely shrugged up against his left ear.  He has palpable spasm in his left trapezius muscle.  Normal left radial pulse.  Normal grip strength on left hand.  No C-spine tenderness.  No thoracic spinal tenderness.  No rash noted.  Neurological: He is alert and oriented to person, place, and time.  Skin: Skin is warm and dry. No rash noted.  Psychiatric: He has a normal mood and affect. Judgment normal.    ED Course  Procedures (including critical care time)  Labs Reviewed - No data to display No results found.   1. Trapezius muscle spasm       MDM  Patient had an obvious and palpable spasm of his left trapezius muscle.  The patient is tender throughout his left trapezius.  After pain medication and muscle relaxants the patient had significant improvement in his pain.  I was able  to finally get the patient to stand at the bedside, relax his shoulder and stand up straight. He then sat in the chair rather than the bed which seemed to help. This is msk related.  No indication for imaging.  No recent falls or trauma.  No systemic symptoms  6:04 AM The patient feels much better at this time and would like to be discharged home.  He is much more relaxed in his left shoulder is improved.  Has followup with his PCP.  This is not  ACS.        Lyanne Co, MD 08/29/11 [phone]

## 2011-08-29 NOTE — ED Notes (Signed)
Pt tolerating po's without nausea. Pt able to move head from side to side; pt sitting straight in chair Reports decreased tightness and spasms. Decreased distress noted Family conversing with spouse without any sx of discomfort noted. Discussed plan of care and informed pt waiting on dc paperwork. Pt verbalizes understanding

## 2011-08-29 NOTE — ED Notes (Signed)
Pt requesting a drink; reports decreased nausea MD notified and verbalized ok for pt to drink. Diet ginger ale provided

## 2011-08-29 NOTE — ED Notes (Signed)
Pt L shoulder has swollen area on top. No redness; denies injury Pain started 8/25. Pt unable to move L arm. When R arm moves pt states pain to L shoulder Pt also states pain with movement of bil legs, pt appears uncomfortable pt can turn neck from side to side however pt very stiff with movement

## 2011-08-29 NOTE — ED Notes (Signed)
Pt reports left shoulder pain, started yesterday became worse tonight. Pt reports pain when he moves his neck, right arm or leg.

## 2013-03-03 ENCOUNTER — Emergency Department (HOSPITAL_COMMUNITY): Payer: Non-veteran care

## 2013-03-03 ENCOUNTER — Observation Stay (HOSPITAL_COMMUNITY)
Admission: EM | Admit: 2013-03-03 | Discharge: 2013-03-04 | Disposition: A | Discharge status: Home / Self Care | Payer: Non-veteran care | Attending: Internal Medicine | Admitting: Internal Medicine

## 2013-03-03 ENCOUNTER — Encounter (HOSPITAL_COMMUNITY): Payer: Self-pay | Admitting: Emergency Medicine

## 2013-03-03 DIAGNOSIS — R5381 Other malaise: Secondary | ICD-10-CM

## 2013-03-03 DIAGNOSIS — R0602 Shortness of breath: Secondary | ICD-10-CM | POA: Insufficient documentation

## 2013-03-03 DIAGNOSIS — I10 Essential (primary) hypertension: Secondary | ICD-10-CM

## 2013-03-03 DIAGNOSIS — Z7982 Long term (current) use of aspirin: Secondary | ICD-10-CM | POA: Insufficient documentation

## 2013-03-03 DIAGNOSIS — E785 Hyperlipidemia, unspecified: Secondary | ICD-10-CM | POA: Diagnosis present

## 2013-03-03 DIAGNOSIS — R059 Cough, unspecified: Secondary | ICD-10-CM | POA: Insufficient documentation

## 2013-03-03 DIAGNOSIS — F430 Acute stress reaction: Secondary | ICD-10-CM | POA: Insufficient documentation

## 2013-03-03 DIAGNOSIS — R05 Cough: Secondary | ICD-10-CM | POA: Insufficient documentation

## 2013-03-03 DIAGNOSIS — R0600 Dyspnea, unspecified: Secondary | ICD-10-CM

## 2013-03-03 DIAGNOSIS — R42 Dizziness and giddiness: Secondary | ICD-10-CM | POA: Insufficient documentation

## 2013-03-03 DIAGNOSIS — R0989 Other specified symptoms and signs involving the circulatory and respiratory systems: Secondary | ICD-10-CM | POA: Insufficient documentation

## 2013-03-03 DIAGNOSIS — I2510 Atherosclerotic heart disease of native coronary artery without angina pectoris: Secondary | ICD-10-CM

## 2013-03-03 DIAGNOSIS — R0609 Other forms of dyspnea: Principal | ICD-10-CM

## 2013-03-03 DIAGNOSIS — E119 Type 2 diabetes mellitus without complications: Secondary | ICD-10-CM

## 2013-03-03 DIAGNOSIS — Z9189 Other specified personal risk factors, not elsewhere classified: Secondary | ICD-10-CM | POA: Insufficient documentation

## 2013-03-03 DIAGNOSIS — Z87891 Personal history of nicotine dependence: Secondary | ICD-10-CM | POA: Insufficient documentation

## 2013-03-03 DIAGNOSIS — R5383 Other fatigue: Secondary | ICD-10-CM

## 2013-03-03 DIAGNOSIS — Z950 Presence of cardiac pacemaker: Secondary | ICD-10-CM | POA: Insufficient documentation

## 2013-03-03 HISTORY — DX: Acute myocardial infarction, unspecified: I21.9

## 2013-03-03 LAB — COMPREHENSIVE METABOLIC PANEL
ALBUMIN: 3.5 g/dL (ref 3.5–5.2)
ALK PHOS: 62 U/L (ref 39–117)
ALT: 21 U/L (ref 0–53)
AST: 18 U/L (ref 0–37)
BUN: 20 mg/dL (ref 6–23)
CHLORIDE: 101 meq/L (ref 96–112)
CO2: 26 mEq/L (ref 19–32)
Calcium: 9 mg/dL (ref 8.4–10.5)
Creatinine, Ser: 0.92 mg/dL (ref 0.50–1.35)
GFR calc Af Amer: >90 mL/min (ref >90)
GFR calc non Af Amer: 86 mL/min — ABNORMAL LOW (ref >90)
Glucose, Bld: 141 mg/dL — ABNORMAL HIGH (ref 70–99)
POTASSIUM: 4 meq/L (ref 3.7–5.3)
SODIUM: 137 meq/L (ref 137–147)
TOTAL PROTEIN: 7.4 g/dL (ref 6.0–8.3)
Total Bilirubin: 0.4 mg/dL (ref 0.3–1.2)

## 2013-03-03 LAB — CBC WITH DIFFERENTIAL/PLATELET
BASOS ABS: 0 10*3/uL (ref 0.0–0.1)
BASOS PCT: 0 % (ref 0–1)
EOS ABS: 0.1 10*3/uL (ref 0.0–0.7)
Eosinophils Relative: 2 % (ref 0–5)
HCT: 40.7 % (ref 39.0–52.0)
Hemoglobin: 14.4 g/dL (ref 13.0–17.0)
Lymphocytes Relative: 35 % (ref 12–46)
Lymphs Abs: 2 10*3/uL (ref 0.7–4.0)
MCH: 32.5 pg (ref 26.0–34.0)
MCHC: 35.4 g/dL (ref 30.0–36.0)
MCV: 91.9 fL (ref 78.0–100.0)
MONOS PCT: 14 % — AB (ref 3–12)
Monocytes Absolute: 0.8 10*3/uL (ref 0.1–1.0)
NEUTROS ABS: 2.7 10*3/uL (ref 1.7–7.7)
NEUTROS PCT: 48 % (ref 43–77)
PLATELETS: 130 10*3/uL — AB (ref 150–400)
RBC: 4.43 MIL/uL (ref 4.22–5.81)
RDW: 13.2 % (ref 11.5–15.5)
WBC: 5.6 10*3/uL (ref 4.0–10.5)

## 2013-03-03 LAB — GLUCOSE, CAPILLARY: Glucose-Capillary: 162 mg/dL — ABNORMAL HIGH (ref 70–99)

## 2013-03-03 LAB — D-DIMER, QUANTITATIVE (NOT AT ARMC): D DIMER QUANT: 0.85 ug{FEU}/mL — AB (ref 0.00–0.48)

## 2013-03-03 LAB — TROPONIN I

## 2013-03-03 LAB — PRO B NATRIURETIC PEPTIDE: Pro B Natriuretic peptide (BNP): 64.1 pg/mL (ref 0–125)

## 2013-03-03 MED ORDER — ASPIRIN EC 81 MG PO TBEC
81.0000 mg | DELAYED_RELEASE_TABLET | Freq: Every morning | ORAL | Status: DC
  Administered 2013-03-04: 81 mg via ORAL
  Filled 2013-03-03: qty 1

## 2013-03-03 MED ORDER — ONDANSETRON HCL 4 MG/2ML IJ SOLN: 4.0000 mg | Freq: Three times a day (TID) | INTRAMUSCULAR | Status: DC | PRN

## 2013-03-03 MED ORDER — SODIUM CHLORIDE 0.9 % IV SOLN
INTRAVENOUS | Status: DC
  Administered 2013-03-03: 21:00:00 via INTRAVENOUS

## 2013-03-03 MED ORDER — ONDANSETRON HCL 4 MG/2ML IJ SOLN: 4.0000 mg | Freq: Four times a day (QID) | INTRAMUSCULAR | Status: DC | PRN

## 2013-03-03 MED ORDER — ATORVASTATIN CALCIUM 40 MG PO TABS
40.0000 mg | ORAL_TABLET | Freq: Every day | ORAL | Status: DC
  Filled 2013-03-03: qty 1

## 2013-03-03 MED ORDER — ACETAMINOPHEN 650 MG RE SUPP: 650.0000 mg | Freq: Four times a day (QID) | RECTAL | Status: DC | PRN

## 2013-03-03 MED ORDER — ONDANSETRON HCL 4 MG PO TABS: 4.0000 mg | ORAL_TABLET | Freq: Four times a day (QID) | ORAL | Status: DC | PRN

## 2013-03-03 MED ORDER — INSULIN ASPART 100 UNIT/ML ~~LOC~~ SOLN: 0.0000 [IU] | Freq: Three times a day (TID) | SUBCUTANEOUS | Status: DC

## 2013-03-03 MED ORDER — INSULIN ASPART 100 UNIT/ML ~~LOC~~ SOLN: 0.0000 [IU] | Freq: Every day | SUBCUTANEOUS | Status: DC

## 2013-03-03 MED ORDER — ACETAMINOPHEN 325 MG PO TABS: 650.0000 mg | ORAL_TABLET | Freq: Four times a day (QID) | ORAL | Status: DC | PRN

## 2013-03-03 MED ORDER — PANTOPRAZOLE SODIUM 40 MG PO TBEC
40.0000 mg | DELAYED_RELEASE_TABLET | Freq: Every day | ORAL | Status: DC
  Administered 2013-03-04: 40 mg via ORAL
  Filled 2013-03-03: qty 1

## 2013-03-03 MED ORDER — SODIUM CHLORIDE 0.9 % IJ SOLN
3.0000 mL | Freq: Two times a day (BID) | INTRAMUSCULAR | Status: DC
  Administered 2013-03-03 – 2013-03-04 (×2): 3 mL via INTRAVENOUS

## 2013-03-03 MED ORDER — ASPIRIN 81 MG PO CHEW
324.0000 mg | CHEWABLE_TABLET | Freq: Once | ORAL | Status: AC
  Administered 2013-03-03: 324 mg via ORAL
  Filled 2013-03-03: qty 4

## 2013-03-03 MED ORDER — LISINOPRIL 10 MG PO TABS
40.0000 mg | ORAL_TABLET | Freq: Every morning | ORAL | Status: DC
  Administered 2013-03-04: 40 mg via ORAL
  Filled 2013-03-03: qty 4

## 2013-03-03 MED ORDER — ENOXAPARIN SODIUM 40 MG/0.4ML ~~LOC~~ SOLN
40.0000 mg | SUBCUTANEOUS | Status: DC
  Administered 2013-03-03: 40 mg via SUBCUTANEOUS
  Filled 2013-03-03: qty 0.4

## 2013-03-03 MED ORDER — INFLUENZA VAC SPLIT QUAD 0.5 ML IM SUSP
0.5000 mL | INTRAMUSCULAR | Status: AC
  Administered 2013-03-04: 0.5 mL via INTRAMUSCULAR
  Filled 2013-03-03: qty 0.5

## 2013-03-03 NOTE — H&P (Signed)
[facility] History and Physical  Renee PainFloyd W Benett JYN:[id number]:[id number] DOB: 1945/06/27 DOA: 03/03/2013  Referring physician: Dr. Elesa MassedWard, ER physician PCP: No primary provider on file.   Chief Complaint: Shortness of breath  HPI: Renee PainFloyd W Stryker is a 68 y.o. male with history of coronary disease and pacemaker placement, presents to the emergency room with complaints of shortness of breath. Patient reports that for the past 2 weeks, he's been feeling increasingly fatigued and generally weak. He's noticed the development of dyspnea on exertion. This has progressively gotten worse. He denies any chest pain but occasionally has noticed fluttering. He's not had any fever. He does occasionally have a cough after eating or drinking anything. He's not had any vomiting or diarrhea. No dysuria. No abdominal pain. He's not had any travel history. No sick contacts. He has not noticed any development of significant edema. He was evaluated in the emergency room where EKG was nonacute, cardiac markers are negative. Since there was concern that his shortness of breath may be an anginal equivalent, has been referred for admission.   Review of Systems:  Pertinent positives as per history of present illness, otherwise negative  Past Medical History  Diagnosis Date  . Diabetes mellitus   . Hypertension   . Hyperlipidemia   . Agent orange exposure   . Coronary artery disease   . PTSD (post-traumatic stress disorder)   . Myocardial infarction 2009    X3 -stent placed and pacemaker (2011)   Past Surgical History  Procedure Laterality Date  . Pacemaker insertion    . Back surgery    . Carotid stent    . Knee surgery     Social History:  reports that he has quit smoking. He does not have any smokeless tobacco history on file. He reports that he does not drink alcohol or use illicit drugs.  No Known Allergies  Family history: Mother had leukemia and lupus, father had lung cancer  Prior to Admission  medications   Medication Sig Start Date End Date Taking? Authorizing Provider  acarbose (PRECOSE) 25 MG tablet Take 50 mg by mouth 2 (two) times daily.   Yes Historical Provider, MD  albuterol (PROVENTIL HFA;VENTOLIN HFA) 108 (90 BASE) MCG/ACT inhaler Inhale 1-2 puffs into the lungs every 6 (six) hours as needed for wheezing or shortness of breath.   Yes Historical Provider, MD  aspirin EC 81 MG tablet Take 81 mg by mouth every morning.    Yes Historical Provider, MD  Aspirin-Acetaminophen-Caffeine (GOODY HEADACHE PO) Take 1 packet by mouth daily as needed (for headache and pain).   Yes Historical Provider, MD  Cyanocobalamin (VITAMIN B 12 PO) Take 1,000 mcg by mouth every evening.    Yes Historical Provider, MD  lisinopril (PRINIVIL,ZESTRIL) 40 MG tablet Take 40 mg by mouth every morning.   Yes Historical Provider, MD  Multiple Vitamin (MULTIVITAMIN WITH MINERALS) TABS tablet Take 1 tablet by mouth every morning.    Yes Historical Provider, MD  Omega-3 Fatty Acids (FISH OIL PO) Take 2,000 mg by mouth 2 (two) times daily.    Yes Historical Provider, MD  omeprazole (PRILOSEC) 20 MG capsule Take 20 mg by mouth every morning.   Yes Historical Provider, MD  simvastatin (ZOCOR) 80 MG tablet Take 40 mg by mouth at bedtime.   Yes Historical Provider, MD  Vitamin D, Ergocalciferol, (DRISDOL) 50000 UNITS CAPS capsule Take 50,000 Units by mouth every Saturday. On saturdays   Yes Historical Provider, MD   Physical Exam: Filed Vitals:  03/03/13 1834  BP: 134/76  Pulse: 64  Temp: 98.2 F (36.8 C)  Resp:     BP 134/76  Pulse 64  Temp(Src) 98.2 F (36.8 C) (Oral)  Resp 19  Ht 5' 8.5" (1.74 m)  Wt 90.719 kg (200 lb)  BMI 29.96 kg/m2  SpO2 98%  General:  Appears calm and comfortable Eyes: PERRL, normal lids, irises & conjunctiva ENT: grossly normal hearing, lips & tongue Neck: no LAD, masses or thyromegaly Cardiovascular: RRR, no m/r/g. No LE edema. Telemetry: SR, no arrhythmias  Respiratory:  CTA bilaterally, no w/r/r. Normal respiratory effort. Abdomen: soft, ntnd Skin: no rash or induration seen on limited exam Musculoskeletal: grossly normal tone BUE/BLE Psychiatric: grossly normal mood and affect, speech fluent and appropriate Neurologic: grossly non-focal.          Labs on Admission:  Basic Metabolic Panel:  Recent Labs Lab 03/03/13 1541  NA 137  K 4.0  CL 101  CO2 26  GLUCOSE 141*  BUN 20  CREATININE 0.92  CALCIUM 9.0   Liver Function Tests:  Recent Labs Lab 03/03/13 1541  AST 18  ALT 21  ALKPHOS 62  BILITOT 0.4  PROT 7.4  ALBUMIN 3.5   No results found for this basename: LIPASE, AMYLASE,  in the last 168 hours No results found for this basename: AMMONIA,  in the last 168 hours CBC:  Recent Labs Lab 03/03/13 1541  WBC 5.6  NEUTROABS 2.7  HGB 14.4  HCT 40.7  MCV 91.9  PLT 130*   Cardiac Enzymes:  Recent Labs Lab 03/03/13 1541  TROPONINI <0.30    BNP (last 3 results) No results found for this basename: PROBNP,  in the last 8760 hours CBG: No results found for this basename: GLUCAP,  in the last 168 hours  Radiological Exams on Admission: Dg Chest 2 View  03/03/2013   CLINICAL DATA:  Cough, congestion, and shortness of breath for 2 weeks  EXAM: CHEST  2 VIEW  COMPARISON:  DG CHEST 1V PORT dated 07/02/2008  FINDINGS: Heart size and vascular pattern are normal. Lungs are clear. Two lead pacer is present with generator over the left thorax. No pneumothorax.  IMPRESSION: No acute findings   Electronically Signed   By: Esperanza Heir M.D.   On: 03/03/2013 13:50    EKG: Independently reviewed. Atrial paced, no acute ST changes  Assessment/Plan Active Problems:   Dyspnea on exertion   Diabetes   Essential hypertension, benign   Other and unspecified hyperlipidemia   CAD (coronary artery disease)   Other malaise and fatigue   DOE (dyspnea on exertion)   1. Dyspnea on exertion. Etiology is not clear. Chest x-ray is negative. EKG is  nonacute. Cardiac markers are negative. We will continue to monitor him on telemetry. Cycle cardiac markers. Check d-dimer. Check BNP. Check 2-D echocardiogram. 2. Generalized fatigue. We'll check TSH. He does not appear to have any ongoing infections. His symptoms may be related to some degree of depression. 3. Pacemaker placement, possibly for sick sinus syndrome. He is concerned whether his pacemaker may be playing a role. This may need to be interrogated. 4. Coronary artery disease. He was monitored on telemetry, we'll cycle cardiac markers. He's not had any chest pain. Continue aspirin. May need to get cardiology input as to whether further workup is needed. He reports having a stress test done approximately a year ago in [place]. We'll request records. 5. Diabetes. Hold oral agents, start sliding scale insulin. 6. Hypertension. Continue  outpatient regimen  Code Status: full code Family Communication: discussed with patient Disposition Plan: discharge home once improved  Time spent:  [facility] Pager [phone]

## 2013-03-03 NOTE — ED Notes (Signed)
Sob for 1 day, has felt weak for 2 weeks.  [facility],  Cough,productive gray sputum.  Nausea, no vomiting.

## 2013-03-03 NOTE — ED Provider Notes (Signed)
TIME SEEN: 4:15 PM  CHIEF COMPLAINT: Shortness of breath, nausea, fatigue, weakness  HPI:  68 y.o. M with  history of hypertension, diabetes, hyperlipidemia, PTSD, sick sinus syndrome status post pacemaker placement who presents to the emergency department with 2-3 weeks of feeling very weak and tired.  He is also had intermittent dizziness and nausea.  Today he states he suffers short of breath with very light exertion and it took approximately 20 minutes before his symptoms resolved.  He denies the use of any chest pain or chest discomfort.  He has had a productive cough for the past several weeks but no fevers or chills.  He has a history of prior tobacco use but quit approximately 10 years ago.  [facility] and cardiology.  He thinks his last stress test was over a year ago.  No prior history of MI or stents.  No prior history of PE or DVT.  Patient reports he feels slightly better at rest currently but still feels very weak.    ROS: See HPI Constitutional: no fever  Eyes: no drainage  ENT: no runny nose   Cardiovascular:  no chest pain  Resp:  SOB  GI: no vomiting GU: no dysuria Integumentary: no rash  Allergy: no hives  Musculoskeletal: no leg swelling  Neurological: no slurred speech ROS otherwise negative  PAST MEDICAL HISTORY/PAST SURGICAL HISTORY:  Past Medical History  Diagnosis Date  . Diabetes mellitus   . Hypertension   . Hyperlipidemia   . Agent orange exposure   . Coronary artery disease   . PTSD (post-traumatic stress disorder)     MEDICATIONS:  Prior to Admission medications   Medication Sig Start Date End Date Taking? Authorizing Provider  aspirin EC 81 MG tablet Take 81 mg by mouth daily.   Yes Historical Provider, MD  Cyanocobalamin (VITAMIN B 12 PO) Take 1 tablet by mouth every evening.   Yes Historical Provider, MD  Multiple Vitamin (MULTIVITAMIN WITH MINERALS) TABS tablet Take 1 tablet by mouth daily.   Yes  Historical Provider, MD  Omega-3 Fatty Acids (FISH OIL PO) Take 2 capsules by mouth 2 (two) times daily.   Yes Historical Provider, MD  simvastatin (ZOCOR) 40 MG tablet Take 20 mg by mouth daily.   Yes Historical Provider, MD  Vitamin D, Ergocalciferol, (DRISDOL) 50000 UNITS CAPS capsule Take 50,000 Units by mouth every 7 (seven) days. On saturdays   Yes Historical Provider, MD    ALLERGIES:  No Known Allergies  SOCIAL HISTORY:  History  Substance Use Topics  . Smoking status: Former Games developermoker  . Smokeless tobacco: Not on file  . Alcohol Use: No    FAMILY HISTORY: History reviewed. No pertinent family history.  EXAM: BP 119/76  Pulse 79  Temp(Src) 98.1 F (36.7 C) (Oral)  Resp 18  Ht 5' 8.5" (1.74 m)  Wt 200 lb (90.719 kg)  BMI 29.96 kg/m2  SpO2 96% CONSTITUTIONAL: Alert and oriented and responds appropriately to questions. Well-appearing; well-nourished HEAD: Normocephalic EYES: Conjunctivae clear, PERRL ENT: normal nose; no rhinorrhea; moist mucous membranes; pharynx without lesions noted NECK: Supple, no meningismus, no LAD  CARD: RRR; S1 and S2 appreciated; no murmurs, no clicks, no rubs, no gallops RESP: Normal chest excursion without splinting or tachypnea; breath sounds clear and equal bilaterally; no wheezes, no rhonchi, no rales,  ABD/GI: Normal bowel sounds; non-distended; soft, non-tender, no rebound, no guarding BACK:  The back appears normal and is  non-tender to palpation, there is no CVA tenderness EXT: Normal ROM in all joints; non-tender to palpation; no edema; normal capillary refill; no cyanosis    SKIN: Normal color for age and race; warm NEURO: Moves all extremities equally PSYCH: The patient's mood and manner are appropriate. Grooming and personal hygiene are appropriate.  MEDICAL DECISION MAKING: Patient here with concerning symptoms for possible anginal equivalent.  We'll obtain cardiac labs.  Chest x-ray shows no infiltrate or edema or pneumothorax.   EKG shows a atrial paced rhythm, right bundle branch block but no new changes.  He is hemodynamic stable.  I recommended admission for ACS rule out.  Patient agrees.  ED PROGRESS: Patient's labs are unremarkable.  Troponin is negative.  Will discuss with hospitalist for admission.     EKG Interpretation  Date/Time:  Monday March 03 2013 13:26:45 EST Ventricular Rate:  74 PR Interval:  208 QRS Duration: 110 QT Interval:  384 QTC Calculation: 426 R Axis:   66 Text Interpretation:  Atrial-paced rhythm Incomplete right bundle branch block When compared with ECG of 25-May-2010 22:51, No significant change was found Confirmed by [facility]  MD, Jonny Ruiz ([id number]) on 03/03/2013 4:03:59 PM         Layla Maw Minh [place], DO 03/03/13 1659

## 2013-03-04 ENCOUNTER — Observation Stay (HOSPITAL_COMMUNITY): Payer: Non-veteran care

## 2013-03-04 DIAGNOSIS — I517 Cardiomegaly: Secondary | ICD-10-CM

## 2013-03-04 LAB — BASIC METABOLIC PANEL
BUN: 18 mg/dL (ref 6–23)
CO2: 25 mEq/L (ref 19–32)
Calcium: 8.9 mg/dL (ref 8.4–10.5)
Chloride: 105 mEq/L (ref 96–112)
Creatinine, Ser: 0.92 mg/dL (ref 0.50–1.35)
GFR calc Af Amer: >90 mL/min (ref >90)
GFR, EST NON AFRICAN AMERICAN: 86 mL/min — AB (ref >90)
GLUCOSE: 137 mg/dL — AB (ref 70–99)
POTASSIUM: 4.4 meq/L (ref 3.7–5.3)
SODIUM: 139 meq/L (ref 137–147)

## 2013-03-04 LAB — CBC
HCT: 40.6 % (ref 39.0–52.0)
Hemoglobin: 14.1 g/dL (ref 13.0–17.0)
MCH: 31.9 pg (ref 26.0–34.0)
MCHC: 34.7 g/dL (ref 30.0–36.0)
MCV: 91.9 fL (ref 78.0–100.0)
Platelets: 126 K/uL — ABNORMAL LOW (ref 150–400)
RBC: 4.42 MIL/uL (ref 4.22–5.81)
RDW: 13.3 % (ref 11.5–15.5)
WBC: 5.5 K/uL (ref 4.0–10.5)

## 2013-03-04 LAB — TROPONIN I
Troponin I: <0.3 ng/mL (ref <0.30)
Troponin I: <0.3 ng/mL (ref <0.30)

## 2013-03-04 LAB — TSH: TSH: 2.094 u[IU]/mL (ref 0.350–4.500)

## 2013-03-04 LAB — GLUCOSE, CAPILLARY
Glucose-Capillary: 101 mg/dL — ABNORMAL HIGH (ref 70–99)
Glucose-Capillary: 111 mg/dL — ABNORMAL HIGH (ref 70–99)

## 2013-03-04 MED ORDER — IOHEXOL 350 MG/ML SOLN
100.0000 mL | Freq: Once | INTRAVENOUS | Status: AC | PRN
  Administered 2013-03-04: 100 mL via INTRAVENOUS

## 2013-03-04 NOTE — Discharge Planning (Signed)
Pt stated he was ready to go and pain was under control.  Pt had Echo completed and was read.  Dr. Kerry HoughMemon released pt to go home.  Pt's IV was removed prior to DC and pt was given DC papers.  Pt walked to car by PCT to drive himself home.  Pt had no had ANY pain meds or narcotics during his stay at the hospital.

## 2013-03-04 NOTE — Discharge Planning (Signed)
Pt scheduled for ECHO.  Called Echo to get estimated time to complete.  Echo has been held up with a few more outpatients and is now hoping to begin with pt by 1430.

## 2013-03-04 NOTE — Progress Notes (Signed)
  Echocardiogram 2D Echocardiogram has been performed.  Marcus Patterson, Marcus Patterson 03/04/2013, 3:06 PM

## 2013-03-04 NOTE — Discharge Summary (Signed)
Physician Discharge Summary  Marcus PainFloyd W Harshman NGE:[id number]:[id number] DOB: 1945/09/01 DOA: 03/03/2013  PCP: No primary provider on file.  Admit date: 03/03/2013 Discharge date: 03/04/2013  Time spent:  minutes  Recommendations for Outpatient Follow-up:  1. [facility]. Has follow up appointment 03/14/13. Recommend possible GI work up for intermittent swallowing issues.   Discharge Diagnoses:  Active Problems:   Dyspnea on exertion   Diabetes   Essential hypertension, benign   Other and unspecified hyperlipidemia   CAD (coronary artery disease)   Other malaise and fatigue   DOE (dyspnea on exertion)   Discharge Condition: stable  Diet recommendation: carb modified heart healthy  Filed Weights   03/03/13 1333 03/03/13 1834 03/04/13 0508  Weight: 90.719 kg (200 lb) 90.719 kg (200 lb) 87.635 kg (193 lb 3.2 oz)    History of present illness:  Marcus Patterson is a 68 y.o. male with history of coronary disease and pacemaker placement, presented to the emergency room on 03/03/13 with complaints of shortness of breath. Patient reported that for the previous 2 weeks, he'd been feeling increasingly fatigued and generally weak. He'd noticed the development of dyspnea on exertion. This had progressively gotten worse. He denied any chest pain but occasionally had noticed fluttering. He'd not had any fever. He did occasionally have a cough after eating or drinking anything. He'd not had any vomiting or diarrhea. No dysuria. No abdominal pain. He'd not had any travel history. No sick contacts. He had not noticed any development of significant edema. He was evaluated in the emergency room where EKG was nonacute, cardiac markers were negative. Since there was concern that his shortness of breath may be an anginal equivalent, was referred for admission   Hospital Course:  1. Dyspnea on exertion. Etiology  Remain unclear. Much improved at discharge. He was admitted and ruled out for ACS. Chest x-ray is negative.  EKG is nonacute. Cardiac markers are negative x3. No events on telemetry. d-dimer slightly elevated so CT angio chest obtained negative for PE, edema or consolidation. Mild atelectasis noted. BNP 64. 2-D echocardiogram yields mild LVH, EF 55-60% with grade 1 diastolic dysfunction. Recommend close OP [facility]  2. Generalized fatigue. Improved at discharge. TSH. He does not appear to have any ongoing infections. His symptoms may be related to some degree of depression. 3. Pacemaker placement, possibly for sick sinus syndrome. No chest pain. No events on tele. Recommend close OP [facility] 4. Coronary artery disease. No events on telemetry. Cardiac markers negative x3. No chest pain. Continue aspirin. He reports [place] [facility] [place] [facility]. Await records. 5. Diabetes. Hold oral agents, sliding scale insulin provided.CBG range 101-162. Resume home regimen 6. Hypertension. Continue outpatient regimen     Procedures:  2 decho  Consultations:  none  Discharge Exam: Filed Vitals:   03/04/13 0508  BP: 102/63  Pulse: 64  Temp: 97.8 F (36.6 C)  Resp: 20    General: well nourished NAD Cardiovascular: RRR No m/g/r No LE edema PPP Respiratory: normal effort BS clear bilaterally to auscultation no wheeze  Discharge Instructions     Medication List         acarbose 25 MG tablet  Commonly known as:  PRECOSE  Take 50 mg by mouth 2 (two) times daily.     albuterol 108 (90 BASE) MCG/ACT inhaler  Commonly known as:  PROVENTIL HFA;VENTOLIN HFA  Inhale 1-2 puffs into  the lungs every 6 (six) hours as needed for wheezing or shortness of breath.     aspirin EC 81 MG tablet  Take 81 mg by mouth every morning.     FISH OIL PO  Take 2,000 mg by mouth 2 (two) times daily.     GOODY HEADACHE PO  Take 1 packet by mouth daily as needed (for headache and pain).     lisinopril 40 MG tablet  Commonly known as:   PRINIVIL,ZESTRIL  Take 40 mg by mouth every morning.     multivitamin with minerals Tabs tablet  Take 1 tablet by mouth every morning.     omeprazole 20 MG capsule  Commonly known as:  PRILOSEC  Take 20 mg by mouth every morning.     simvastatin 80 MG tablet  Commonly known as:  ZOCOR  Take 40 mg by mouth at bedtime.     VITAMIN B 12 PO  Take 1,000 mcg by mouth every evening.     Vitamin D (Ergocalciferol) 50000 UNITS Caps capsule  Commonly known as:  DRISDOL  Take 50,000 Units by mouth every Saturday. On saturdays       No Known Allergies    The results of significant diagnostics from this hospitalization (including imaging, microbiology, ancillary and laboratory) are listed below for reference.    Significant Diagnostic Studies: Dg Chest 2 View  03/03/2013   CLINICAL DATA:  Cough, congestion, and shortness of breath for 2 weeks  EXAM: CHEST  2 VIEW  COMPARISON:  DG CHEST 1V PORT dated 07/02/2008  FINDINGS: Heart size and vascular pattern are normal. Lungs are clear. Two lead pacer is present with generator over the left thorax. No pneumothorax.  IMPRESSION: No acute findings   Electronically Signed   By: Esperanza Heir M.D.   On: 03/03/2013 13:50   Ct Angio Chest Pe W/cm &/or Wo Cm  03/04/2013   CLINICAL DATA:  Shortness of Breath  EXAM: CT ANGIOGRAPHY CHEST WITH CONTRAST  TECHNIQUE: Multidetector CT imaging of the chest was performed using the standard protocol during bolus administration of intravenous contrast. Multiplanar CT image reconstructions and MIPs were obtained to evaluate the vascular anatomy.  CONTRAST:  OMNIPAQUE IOHEXOL 350 MG/ML SOLN  COMPARISON:  Chest radiograph March 03, 2013  FINDINGS: There is no demonstrable pulmonary embolus. There is no thoracic aortic aneurysm or dissection.  There is patchy atelectatic change in both lower lobes, slightly more on the right than on the left. There is no edema or consolidation.  Pacemaker leads are attached to the right  atrium and right ventricle. Pericardium is not thickened. There are foci of coronary artery calcification. There is no appreciable thoracic adenopathy.  Visualized upper abdominal structures appear normal. There is degenerative change in the thoracic spine. There are no blastic or lytic bone lesions. Thyroid appears normal.  Review of the MIP images confirms the above findings.  IMPRESSION: No demonstrable pulmonary embolus. No edema or consolidation. There is patchy bibasilar atelectasis, slightly more on the right than on the left.   Electronically Signed   By: Bretta Bang M.D.   On: 03/04/2013 08:27    Microbiology: No results found for this or any previous visit (from the past 240 hour(s)).   Labs: Basic Metabolic Panel:  Recent Labs Lab 03/03/13 1541 03/04/13 0612  NA 137 139  K 4.0 4.4  CL 101 105  CO2 26 25  GLUCOSE 141* 137*  BUN 20 18  CREATININE 0.92 0.92  CALCIUM 9.0  8.9   Liver Function Tests:  Recent Labs Lab 03/03/13 1541  AST 18  ALT 21  ALKPHOS 62  BILITOT 0.4  PROT 7.4  ALBUMIN 3.5   No results found for this basename: LIPASE, AMYLASE,  in the last 168 hours No results found for this basename: AMMONIA,  in the last 168 hours CBC:  Recent Labs Lab 03/03/13 1541 03/04/13 0612  WBC 5.6 5.5  NEUTROABS 2.7  --   HGB 14.4 14.1  HCT 40.7 40.6  MCV 91.9 91.9  PLT 130* 126*   Cardiac Enzymes:  Recent Labs Lab 03/03/13 1541 03/03/13 1901 03/04/13 0046 03/04/13 0612  TROPONINI <0.30 <0.30 <0.30 <0.30   BNP: BNP (last 3 results)  Recent Labs  03/03/13 1541  PROBNP 64.1   CBG:  Recent Labs Lab 03/03/13 2025 03/04/13 0824  GLUCAP 162* 101*       Signed:  Wilmina Maxham M  [facility] 03/04/2013, 9:44 AM

## 2013-03-04 NOTE — Discharge Summary (Signed)
Patient seen and examined. Above reviewed.  Workup in the hospital has been unremarkable. Echocardiogram/CT images has been unremarkable. His shortness of breath has resolved. Cardiac enzymes have been negative and EKG is unremarkable. His followup with his primary care physician one week. The patient has been adequately observed in the hospital and can be discharged.  MEMON,JEHANZEB

## 2013-03-04 NOTE — Care Management Note (Signed)
    Page 1 of 1   03/04/2013     2:07:03 PM   CARE MANAGEMENT NOTE 03/04/2013  Patient:  Renee PainCRAWFORD,Ramelo W   Account Number:  [id number]  Date Initiated:  03/04/2013  Documentation initiated by:  Rosemary HolmsOBSON,Kyndle Schlender  Subjective/Objective Assessment:   Pt admitted from home where he lives with his wife. Pt is [employer] insured. Per pt, CM faxed to attn: Hilda LiasMarie, [phone] H&P and DC summary.     Action/Plan:   Anticipated DC Date:  03/04/2013   Anticipated DC Plan:  HOME/SELF CARE      DC Planning Services  CM consult      Choice offered to / List presented to:             Status of service:  Completed, signed off Medicare Important Message given?   (If response is "NO", the following Medicare IM given date fields will be blank) Date Medicare IM given:   Date Additional Medicare IM given:    Discharge Disposition:  HOME/SELF CARE  Per UR Regulation:    If discussed at Long Length of Stay Meetings, dates discussed:    Comments:  03/04/13 Rosemary HolmsAmy Nusaybah Ivie RN BSN CM

## 2013-03-05 LAB — GLUCOSE, CAPILLARY: Glucose-Capillary: 174 mg/dL — ABNORMAL HIGH (ref 70–99)

## 2013-08-04 ENCOUNTER — Encounter (HOSPITAL_COMMUNITY): Payer: Self-pay | Admitting: Emergency Medicine

## 2013-08-04 ENCOUNTER — Emergency Department (HOSPITAL_COMMUNITY)
Admission: EM | Admit: 2013-08-04 | Discharge: 2013-08-04 | Disposition: A | Discharge status: Home / Self Care | Payer: Non-veteran care | Attending: Emergency Medicine | Admitting: Emergency Medicine

## 2013-08-04 DIAGNOSIS — Z79899 Other long term (current) drug therapy: Secondary | ICD-10-CM | POA: Insufficient documentation

## 2013-08-04 DIAGNOSIS — I252 Old myocardial infarction: Secondary | ICD-10-CM | POA: Insufficient documentation

## 2013-08-04 DIAGNOSIS — Z8659 Personal history of other mental and behavioral disorders: Secondary | ICD-10-CM | POA: Insufficient documentation

## 2013-08-04 DIAGNOSIS — M5432 Sciatica, left side: Secondary | ICD-10-CM

## 2013-08-04 DIAGNOSIS — E785 Hyperlipidemia, unspecified: Secondary | ICD-10-CM | POA: Insufficient documentation

## 2013-08-04 DIAGNOSIS — I10 Essential (primary) hypertension: Secondary | ICD-10-CM | POA: Insufficient documentation

## 2013-08-04 DIAGNOSIS — Z7982 Long term (current) use of aspirin: Secondary | ICD-10-CM | POA: Insufficient documentation

## 2013-08-04 DIAGNOSIS — Z9889 Other specified postprocedural states: Secondary | ICD-10-CM | POA: Insufficient documentation

## 2013-08-04 DIAGNOSIS — Z87891 Personal history of nicotine dependence: Secondary | ICD-10-CM | POA: Insufficient documentation

## 2013-08-04 DIAGNOSIS — M25559 Pain in unspecified hip: Secondary | ICD-10-CM | POA: Insufficient documentation

## 2013-08-04 DIAGNOSIS — M5430 Sciatica, unspecified side: Secondary | ICD-10-CM | POA: Insufficient documentation

## 2013-08-04 DIAGNOSIS — Z9861 Coronary angioplasty status: Secondary | ICD-10-CM | POA: Insufficient documentation

## 2013-08-04 DIAGNOSIS — I2510 Atherosclerotic heart disease of native coronary artery without angina pectoris: Secondary | ICD-10-CM | POA: Insufficient documentation

## 2013-08-04 DIAGNOSIS — E119 Type 2 diabetes mellitus without complications: Secondary | ICD-10-CM | POA: Insufficient documentation

## 2013-08-04 DIAGNOSIS — Z950 Presence of cardiac pacemaker: Secondary | ICD-10-CM | POA: Insufficient documentation

## 2013-08-04 MED ORDER — METHOCARBAMOL 500 MG PO TABS: 500.0000 mg | ORAL_TABLET | Freq: Three times a day (TID) | ORAL | Status: DC

## 2013-08-04 MED ORDER — METHOCARBAMOL 500 MG PO TABS
500.0000 mg | ORAL_TABLET | Freq: Once | ORAL | Status: AC
  Administered 2013-08-04: 500 mg via ORAL
  Filled 2013-08-04: qty 1

## 2013-08-04 MED ORDER — HYDROCODONE-ACETAMINOPHEN 5-325 MG PO TABS
1.0000 | ORAL_TABLET | Freq: Once | ORAL | Status: AC
  Administered 2013-08-04: 1 via ORAL
  Filled 2013-08-04: qty 1

## 2013-08-04 MED ORDER — IBUPROFEN 600 MG PO TABS: 600.0000 mg | ORAL_TABLET | Freq: Three times a day (TID) | ORAL | Status: DC

## 2013-08-04 MED ORDER — HYDROCODONE-ACETAMINOPHEN 5-325 MG PO TABS: ORAL_TABLET | ORAL | Status: DC

## 2013-08-04 NOTE — Discharge Instructions (Signed)
Sciatica °Sciatica is pain, weakness, numbness, or tingling along your sciatic nerve. The nerve starts in the lower back and runs down the back of each leg. Nerve damage or certain conditions pinch or put pressure on the sciatic nerve. This causes the pain, weakness, and other discomforts of sciatica. °HOME CARE  °· Only take medicine as told by your doctor. °· Apply ice to the affected area for 20 minutes. Do this 3-4 times a day for the first 48-72 hours. Then try heat in the same way. °· Exercise, stretch, or do your usual activities if these do not make your pain worse. °· Go to physical therapy as told by your doctor. °· Keep all doctor visits as told. °· Do not wear high heels or shoes that are not supportive. °· Get a firm mattress if your mattress is too soft to lessen pain and discomfort. °GET HELP RIGHT AWAY IF:  °· You cannot control when you poop (bowel movement) or pee (urinate). °· You have more weakness in your lower back, lower belly (pelvis), butt (buttocks), or legs. °· You have redness or puffiness (swelling) of your back. °· You have a burning feeling when you pee. °· You have pain that gets worse when you lie down. °· You have pain that wakes you from your sleep. °· Your pain is worse than past pain. °· Your pain lasts longer than 4 weeks. °· You are suddenly losing weight without reason. °MAKE SURE YOU:  °· Understand these instructions. °· Will watch this condition. °· Will get help right away if you are not doing well or get worse. °Document Released: 09/28/2007 Document Revised: 06/20/2011 Document Reviewed: 04/30/2011 °ExitCare® Patient Information ©2015 [employer]. This information is not intended to replace advice given to you by your health care provider. Make sure you discuss any questions you have with your health care provider. ° °

## 2013-08-04 NOTE — ED Notes (Signed)
Patient with no complaints at this time. Respirations even and unlabored. Skin warm/dry. Discharge instructions reviewed with patient at this time. Patient given opportunity to voice concerns/ask questions. Patient discharged at this time and left Emergency Department with steady gait.   

## 2013-08-04 NOTE — ED Notes (Signed)
Patient c/o left hip/buttock pain that started one week ago. Per patient only hurts with sitting/pressure. Patient reports using Asper Cream and ibuprofen with no relief.

## 2013-08-06 NOTE — ED Provider Notes (Signed)
CSN: [id number]     Arrival date & time 08/04/13  0940 History   First MD Initiated Contact with Patient 08/04/13 [phone]     Chief Complaint  Patient presents with  . Hip Pain     (Consider location/radiation/quality/duration/timing/severity/associated sxs/prior Treatment)  Marcus Patterson is a 68 y.o. male who presents to the Emergency Department complaining of left hip, back and buttock pain for one week.  He denies fall, or heavy lifting.  Pt reports hx of previous back surgery.  He denies pain radiating into the left leg or numbness or weakness of the LLE, no incontinence of bladder or bowel Patient is a 68 y.o. male presenting with hip pain. The history is provided by the patient.  Hip Pain This is a recurrent problem. The current episode started in the past 7 days. The problem occurs constantly. The problem has been unchanged. Associated symptoms include arthralgias. Pertinent negatives include no abdominal pain, chest pain, chills, fever, joint swelling, neck pain, numbness, rash, urinary symptoms, vomiting or weakness. The symptoms are aggravated by bending and twisting (sitting). He has tried position changes (muscle rubs) for the symptoms. The treatment provided no relief.    Past Medical History  Diagnosis Date  . Diabetes mellitus   . Hypertension   . Hyperlipidemia   . Agent orange exposure   . Coronary artery disease   . PTSD (post-traumatic stress disorder)   . Myocardial infarction 2009    X3 -stent placed and pacemaker (2011)   Past Surgical History  Procedure Laterality Date  . Pacemaker insertion    . Back surgery    . Carotid stent    . Knee surgery     History reviewed. No pertinent family history. History  Substance Use Topics  . Smoking status: Former Smoker -- 3.00 packs/day for 40 years    Types: Cigarettes    Quit date: 01/03/2007  . Smokeless tobacco: Former NeurosurgeonUser    Types: Chew, Snuff    Quit date: 01/03/2011  . Alcohol Use: No    Review of  Systems  Constitutional: Negative for fever and chills.  Respiratory: Negative for shortness of breath.   Cardiovascular: Negative for chest pain.  Gastrointestinal: Negative for vomiting, abdominal pain and constipation.  Genitourinary: Negative for dysuria, frequency, hematuria, flank pain, decreased urine volume, difficulty urinating and testicular pain.       No perineal numbness or incontinence of urine or feces  Musculoskeletal: Positive for arthralgias and back pain. Negative for joint swelling and neck pain.  Skin: Negative for rash.  Neurological: Negative for weakness and numbness.  All other systems reviewed and are negative.     Allergies  Review of patient's allergies indicates no known allergies.  Home Medications   Prior to Admission medications   Medication Sig Start Date End Date Taking? Authorizing Provider  acarbose (PRECOSE) 25 MG tablet Take 50 mg by mouth 2 (two) times daily.    Historical Provider, MD  albuterol (PROVENTIL HFA;VENTOLIN HFA) 108 (90 BASE) MCG/ACT inhaler Inhale 1-2 puffs into the lungs every 6 (six) hours as needed for wheezing or shortness of breath.    Historical Provider, MD  aspirin EC 81 MG tablet Take 81 mg by mouth every morning.     Historical Provider, MD  Aspirin-Acetaminophen-Caffeine (GOODY HEADACHE PO) Take 1 packet by mouth daily as needed (for headache and pain).    Historical Provider, MD  Cyanocobalamin (VITAMIN B 12 PO) Take 1,000 mcg by mouth every evening.  Historical Provider, MD  HYDROcodone-acetaminophen (NORCO/VICODIN) 5-325 MG per tablet Take one-two tabs po q 4-6 hrs prn pain 08/04/13   Ladasha Schnackenberg L. Latrise Bowland, PA-C  ibuprofen (ADVIL,MOTRIN) 600 MG tablet Take 1 tablet (600 mg total) by mouth 3 (three) times daily. Take with food 08/04/13   Lauramae Kneisley L. Bernon Arviso, PA-C  lisinopril (PRINIVIL,ZESTRIL) 40 MG tablet Take 40 mg by mouth every morning.    Historical Provider, MD  methocarbamol (ROBAXIN) 500 MG tablet Take 1 tablet (500 mg  total) by mouth 3 (three) times daily. 08/04/13   Salvatore Poe L. Janalyn Higby, PA-C  Multiple Vitamin (MULTIVITAMIN WITH MINERALS) TABS tablet Take 1 tablet by mouth every morning.     Historical Provider, MD  Omega-3 Fatty Acids (FISH OIL PO) Take 2,000 mg by mouth 2 (two) times daily.     Historical Provider, MD  omeprazole (PRILOSEC) 20 MG capsule Take 20 mg by mouth every morning.    Historical Provider, MD  simvastatin (ZOCOR) 80 MG tablet Take 40 mg by mouth at bedtime.    Historical Provider, MD  Vitamin D, Ergocalciferol, (DRISDOL) 50000 UNITS CAPS capsule Take 50,000 Units by mouth every Saturday. On saturdays    Historical Provider, MD   BP 114/81  Pulse 85  Temp(Src) 98.1 F (36.7 C) (Oral)  Resp 18  Ht 5' 8.5" (1.74 m)  Wt 192 lb (87.091 kg)  BMI 28.77 kg/m2  SpO2 97% Physical Exam  Nursing note and vitals reviewed. Constitutional: He is oriented to person, place, and time. He appears well-developed and well-nourished. No distress.  HENT:  Head: Normocephalic and atraumatic.  Neck: Normal range of motion. Neck supple.  Cardiovascular: Normal rate, regular rhythm, normal heart sounds and intact distal pulses.   No murmur heard. Pulmonary/Chest: Effort normal and breath sounds normal. No respiratory distress.  Abdominal: Soft. He exhibits no distension. There is no tenderness.  Musculoskeletal: He exhibits tenderness. He exhibits no edema.       Lumbar back: He exhibits tenderness and pain. He exhibits normal range of motion, no swelling, no deformity, no laceration and normal pulse.  Localized ttp of the lower left lumbar paraspinal muscles, SI joint and upper buttock.  No spinal tenderness. No rash or edema.   DP pulses are brisk and symmetrical.  Distal sensation intact.  Hip Flexors/Extensors are intact.  Pt has 5/5 strength against resistance of bilateral lower extremities.     Neurological: He is alert and oriented to person, place, and time. He has normal strength. No sensory  deficit. He exhibits normal muscle tone. Coordination and gait normal.  Reflex Scores:      Patellar reflexes are 2+ on the right side and 2+ on the left side.      Achilles reflexes are 2+ on the right side and 2+ on the left side. Skin: Skin is warm and dry. No rash noted.    ED Course  Procedures (including critical care time) Labs Review Labs Reviewed - No data to display  Imaging Review No results found.   EKG Interpretation None      MDM   Final diagnoses:  Sciatica, left    Pt is ambulatory, no focal neuro deficits.  Pain to hip likely related to sciatica.  No concerning sx's for emergent neurological or infectious process. Agrees to tx with robaxin and vicodin.  VSS.  Pt appears stable for d/c.  Agrees to close f/u with PMD for recheck.    Kamoni Gentles L. Gerrica Cygan, PA-C 08/06/13 1607

## 2013-08-07 NOTE — ED Provider Notes (Signed)
Medical screening examination/treatment/procedure(s) were performed by non-physician practitioner and as supervising physician I was immediately available for consultation/collaboration.   EKG Interpretation None        Shon Batonourtney F Betta Balla, MD 08/07/13 1800

## 2014-01-15 ENCOUNTER — Emergency Department (HOSPITAL_COMMUNITY)
Admission: EM | Admit: 2014-01-15 | Discharge: 2014-01-15 | Disposition: A | Discharge status: Home / Self Care | Payer: Non-veteran care | Attending: Emergency Medicine | Admitting: Emergency Medicine

## 2014-01-15 ENCOUNTER — Encounter (HOSPITAL_COMMUNITY): Payer: Self-pay | Admitting: Emergency Medicine

## 2014-01-15 ENCOUNTER — Emergency Department (HOSPITAL_COMMUNITY): Payer: Non-veteran care

## 2014-01-15 DIAGNOSIS — Z87891 Personal history of nicotine dependence: Secondary | ICD-10-CM | POA: Diagnosis not present

## 2014-01-15 DIAGNOSIS — I10 Essential (primary) hypertension: Secondary | ICD-10-CM | POA: Insufficient documentation

## 2014-01-15 DIAGNOSIS — Z79899 Other long term (current) drug therapy: Secondary | ICD-10-CM | POA: Diagnosis not present

## 2014-01-15 DIAGNOSIS — Z950 Presence of cardiac pacemaker: Secondary | ICD-10-CM | POA: Diagnosis not present

## 2014-01-15 DIAGNOSIS — I2510 Atherosclerotic heart disease of native coronary artery without angina pectoris: Secondary | ICD-10-CM | POA: Insufficient documentation

## 2014-01-15 DIAGNOSIS — Z7982 Long term (current) use of aspirin: Secondary | ICD-10-CM | POA: Insufficient documentation

## 2014-01-15 DIAGNOSIS — R42 Dizziness and giddiness: Secondary | ICD-10-CM | POA: Diagnosis present

## 2014-01-15 DIAGNOSIS — Z8659 Personal history of other mental and behavioral disorders: Secondary | ICD-10-CM | POA: Insufficient documentation

## 2014-01-15 DIAGNOSIS — E785 Hyperlipidemia, unspecified: Secondary | ICD-10-CM | POA: Insufficient documentation

## 2014-01-15 DIAGNOSIS — I252 Old myocardial infarction: Secondary | ICD-10-CM | POA: Diagnosis not present

## 2014-01-15 DIAGNOSIS — E119 Type 2 diabetes mellitus without complications: Secondary | ICD-10-CM | POA: Diagnosis not present

## 2014-01-15 HISTORY — DX: Headache: R51

## 2014-01-15 HISTORY — DX: Dizziness and giddiness: R42

## 2014-01-15 HISTORY — DX: Headache, unspecified: R51.9

## 2014-01-15 LAB — COMPREHENSIVE METABOLIC PANEL
ALT: 27 U/L (ref 0–53)
AST: 23 U/L (ref 0–37)
Albumin: 4 g/dL (ref 3.5–5.2)
Alkaline Phosphatase: 65 U/L (ref 39–117)
Anion gap: 6 (ref 5–15)
BILIRUBIN TOTAL: 0.7 mg/dL (ref 0.3–1.2)
BUN: 21 mg/dL (ref 6–23)
CALCIUM: 8.7 mg/dL (ref 8.4–10.5)
CO2: 23 mmol/L (ref 19–32)
Chloride: 108 mEq/L (ref 96–112)
Creatinine, Ser: 0.91 mg/dL (ref 0.50–1.35)
GFR, EST NON AFRICAN AMERICAN: 86 mL/min — AB (ref >90)
Glucose, Bld: 164 mg/dL — ABNORMAL HIGH (ref 70–99)
Potassium: 3.7 mmol/L (ref 3.5–5.1)
Sodium: 137 mmol/L (ref 135–145)
TOTAL PROTEIN: 7.2 g/dL (ref 6.0–8.3)

## 2014-01-15 LAB — CBC WITH DIFFERENTIAL/PLATELET
BASOS PCT: 0 % (ref 0–1)
Basophils Absolute: 0 10*3/uL (ref 0.0–0.1)
EOS PCT: 2 % (ref 0–5)
Eosinophils Absolute: 0.1 10*3/uL (ref 0.0–0.7)
HCT: 38.5 % — ABNORMAL LOW (ref 39.0–52.0)
Hemoglobin: 13 g/dL (ref 13.0–17.0)
LYMPHS PCT: 40 % (ref 12–46)
Lymphs Abs: 2.3 10*3/uL (ref 0.7–4.0)
MCH: 30.9 pg (ref 26.0–34.0)
MCHC: 33.8 g/dL (ref 30.0–36.0)
MCV: 91.4 fL (ref 78.0–100.0)
MONO ABS: 0.6 10*3/uL (ref 0.1–1.0)
Monocytes Relative: 11 % (ref 3–12)
Neutro Abs: 2.7 10*3/uL (ref 1.7–7.7)
Neutrophils Relative %: 47 % (ref 43–77)
Platelets: 141 10*3/uL — ABNORMAL LOW (ref 150–400)
RBC: 4.21 MIL/uL — ABNORMAL LOW (ref 4.22–5.81)
RDW: 13.3 % (ref 11.5–15.5)
WBC: 5.8 10*3/uL (ref 4.0–10.5)

## 2014-01-15 LAB — TROPONIN I: Troponin I: <0.03 ng/mL (ref <0.031)

## 2014-01-15 MED ORDER — ONDANSETRON 4 MG PO TBDP: 4.0000 mg | ORAL_TABLET | Freq: Three times a day (TID) | ORAL | Status: DC | PRN

## 2014-01-15 MED ORDER — PROMETHAZINE HCL 25 MG/ML IJ SOLN
12.5000 mg | INTRAMUSCULAR | Status: DC | PRN
  Administered 2014-01-15: 12.5 mg via INTRAVENOUS
  Filled 2014-01-15: qty 1

## 2014-01-15 MED ORDER — MECLIZINE HCL 25 MG PO TABS: 25.0000 mg | ORAL_TABLET | Freq: Three times a day (TID) | ORAL | Status: DC | PRN

## 2014-01-15 MED ORDER — IOHEXOL 350 MG/ML SOLN
80.0000 mL | Freq: Once | INTRAVENOUS | Status: AC | PRN
  Administered 2014-01-15: 80 mL via INTRAVENOUS

## 2014-01-15 NOTE — Discharge Instructions (Signed)
°Emergency Department Resource Guide °1) Find a Doctor and Pay Out of Pocket °Although you won't have to find out who is covered by your insurance plan, it is a good idea to ask around and get recommendations. You will then need to call the office and see if the doctor you have chosen will accept you as a new patient and what types of options they offer for patients who are self-pay. Some doctors offer discounts or will set up payment plans for their patients who do not have insurance, but you will need to ask so you aren't surprised when you get to your appointment. ° °2) Contact Your Local Health Department °Not all health departments have doctors that can see patients for sick visits, but many do, so it is worth a call to see if yours does. If you don't know where your local health department is, you can check in your phone book. The CDC also has a tool to help you locate your state's health department, and many state websites also have listings of all of their local health departments. ° °3) Find a Walk-in Clinic °If your illness is not likely to be very severe or complicated, you may want to try a walk in clinic. These are popping up all over the country in pharmacies, drugstores, and shopping centers. They're usually staffed by nurse practitioners or physician assistants that have been trained to treat common illnesses and complaints. They're usually fairly quick and inexpensive. However, if you have serious medical issues or chronic medical problems, these are probably not your best option. ° °No Primary Care Doctor: °- Call [facility] at  832-8000 - they can help you locate a primary care doctor that  accepts your insurance, provides certain services, etc. °- Physician Referral Service- 1-800-533-3463 ° °Chronic Pain Problems: °Organization         Address  Phone   Notes  °[facility]  (336) 297-2271 Patients need to be referred by their primary care doctor.  ° °Medication  Assistance: °Organization         Address  Phone   Notes  °[place] [facility] [address] °[place], [place] [address] (336) 641-8030 --Must be a resident of [place] °-- Must have NO insurance coverage whatsoever (no Medicaid/ Medicare, etc.) °-- The pt. MUST have a primary care doctor that directs their care regularly and follows them in the community °  °[facility]  (866) 331-1348   °[employer]  (888) 892-1162   ° °Agencies that provide inexpensive medical care: °Organization         Address  Phone   Notes  °[facility]  (336) 832-8035   °[facility]    (336) 832-7272   °[facility] [address] °[place], [place] [address] (336) 832-4777   °[facility] [address], °[place] (336) 271-4999   °[facility]    (336) 373-0678   °[facility]    (336) 272-1050   °[facility] ° [address], [place] Phone:  (336) 832-4444, Fax:  (336) 832-4440 Hours of Operation:  9 am - 6 pm, M-F.  Also accepts Medicaid/Medicare and self-pay.  °[facility] ° [address], [place] Phone: (336) 832-3150, Fax: (336) 832-3151. Hours of Operation:  8:30 am - 5:30 pm, M-F.  Also accepts Medicaid and self-pay.  °[facility] [place] [address], [place] Phone: (336) 878-6027   °[facility] [address], [place], [place] (336)723-1848, Ext. 123 Mondays & Thursdays: 7-9 AM.  First 15 patients are seen on a first come, first serve basis. °  ° °Medicaid-accepting [place] Providers: ° °Organization         Address  Phone   Notes  °[facility] [address], [place] (336) 641-2100 Also accepts self-pay patients.  °[facility] [address], [place] ° (336) 856-9996   °[facility] [address], [place]  (336) 288-8857   °[facility] [address] [place] [address], [place] (336) 299-7000   °Veita Bland [address], [place]  ° (336) 373-1557 Only accepts [place] Access Medicaid patients after they have their name applied to their card.  ° °Self-Pay (no insurance) in [place]: ° °Organization         Address  Phone   Notes  °Sickle Cell Patients, [facility] [address], [place] (336) 832-1970   °[facility] [address], [place] (336) 832-4400   °[facility] ° [address], [place] (336) 992-4800   °[facility]/Dr. Osei-Bonsu ° [address] [place] [address], [place] or [address], [place] (336) 841-8500 Phone number for both [place] and [place] locations is the same.  °[facility] [address], [place] (336) 299-0000   °[facility] [address] [place] [address], [place] or [address] (336) 852-7530 °(336) 878-2260   °[facility] [address], [place] (336) 350-1642, phone; (336) 294-5005, fax Sees patients 1st and 3rd Saturday of every month.  Must not qualify for public or private insurance (i.e. Medicaid, Medicare, [place] Health Choice, Veterans' Benefits) • Household income should be no more than 200% of the poverty level •The clinic cannot treat you if you are pregnant or think you are pregnant • Sexually transmitted diseases are not treated at the clinic.  ° ° °Dental Care: °Organization         Address  Phone  Notes  °[place] [facility] [address], [place] (336) 641-6152 Accepts children up to age 21 who are enrolled in Medicaid or [place] Health Choice; pregnant women with a Medicaid card; and children who have applied for Medicaid or [place] Health Choice, but were declined, whose parents can pay a reduced fee at time of service.  °[place]  [facility] [place]  [address], [place] (336) 641-7733 Accepts children up to age 21 who are enrolled in Medicaid or [place] Health Choice; pregnant women with a Medicaid card; and children who have applied for Medicaid or [place] Health Choice, but were declined, whose parents can pay a reduced fee at time of service.  °[facility] ° [address], [place] (336) 641-4533 Patients are seen by appointment only. Walk-ins are not accepted. [facility] will see patients 18 years of age and older. °Monday - Tuesday (8am-5pm) °Most Wednesdays (8:30-5pm) °$30 per visit, cash only  °[facility] ° [address], [place] (336) 641-4533 Patients are seen by appointment only. Walk-ins are not accepted. [facility] will see patients 18 years of age and older. °One   Wednesday Evening (Monthly: Volunteer Based).  $30 per visit, cash only  °[facility]  (919) 537-3737 for adults; Children under age 4, call Graduate Pediatric Dentistry at ([phone]) [phone]. Children aged 4-14, please call (919) [phone] to request a pediatric application. ° Dental services are provided in all areas of dental care including fillings, crowns and bridges, complete and partial dentures, implants, gum treatment, root canals, and extractions. Preventive care is also provided. Treatment is provided to both adults and children. °Patients are selected via a lottery and there is often a waiting list. °  °[facility] [address], °[place] ° (336) 763-8833 [email or web address] °  °[facility] [address], [place], [place] (336)723-1848, Ext. 123 Second and Fourth Thursday of each month, opens at 6:30 AM; Clinic ends at 9 AM.  Patients are seen on a first-come first-served basis, and a limited number are seen during each clinic.  ° °[facility] ° [address], [place], [place] (336) 723-7904    Eligibility Requirements °You must have lived in [place], [place], or [place] counties for at least the last three months. °  You cannot be eligible for state or federal sponsored healthcare insurance, including Veterans Administration, Medicaid, or Medicare. °  You generally cannot be eligible for healthcare insurance through your employer.  °  How to apply: °Eligibility screenings are held every Tuesday and Wednesday afternoon from 1:00 pm until 4:00 pm. You do not need an appointment for the interview!  °[facility] [address], [place], [place] 336-631-2330   °[facility]  336-342-8273   °[facility]  336-703-3100   °[facility]  336-570-6415   ° °Behavioral Health Resources in the Community: °Intensive Outpatient Programs °Organization         Address  Phone  Notes  °[facility] [address], [place], [place] 336-878-6098   °[facility] [address], [place], [place] 336-832-9800   °[facility] [address], [place], [place] ° 336-882-2125   °[facility] [address],  °[place], [place] 1-800-853-5163 or 336-641-4981   °Substance Abuse Resources °Organization         Address  Phone  Notes  °[facility]  336-882-2125   °[facility]  336-784-9470   °[facility]  336-285-9073   °[facility]  336-845-3988   °Residential & Outpatient Substance Abuse Program  1-800-659-3381   °Psychological Services °Organization         Address  Phone  Notes  °[facility]  336- 832-9600   °[facility]  336- 378-7881   °[facility] [address], [place] 1-800-853-5163 or 336-641-4981   ° °Mobile Crisis Teams °Organization         Address  Phone  Notes  °[facility], Mobile Crisis Care Unit  1-877-626-1772   °Assertive °[facility] ° [address]  [place], [place] 336-834-9664   °Sharon DeEsch [address] °[place] 336-554-5454   ° °Self-Help/Support Groups °Organization         Address  Phone             Notes  °[facility]  - variety of support groups  336- 373-1402 Call for more information  °Narcotics Anonymous (NA), [facility] [address], °[place]  2 meetings at this location  ° °  Residential Treatment Programs Organization         Address  Phone  Notes  [facility] [address],    [place]  [phone]   [facility]  [address], [place] [phone], [place], [place] [phone]   [facility] [address], [place] [phone] Admissions: 8am-3pm M-F  [facility] [address]    [place], [place] 295-284-1324(780)[phone]   [facility] [address], [place], [place] [phone]   [facility] [address]  [place], [place] [phone]   [facility] Intensive Outpatient [address], [place], [place] [phone]   [facility] ([facility] [address]  [place], [place] [phone] or [phone]   [facility] [address] [place], [place] 323-557-3220(772) [phone] Accepts Medicaid  [facility] [address]  [place] [phone] Substance Abuse/Addiction Treatment   [facility]         Address  Phone  Notes  [facility]  435-727-4659(888) 931-125-2503   Angie FavaJulie Brannon, PhD [address], [address] [place], [place]   (646) [phone](336) [phone] or 337-396-6600(336) ([phone]   [facility]   [address] [place], [place] ([phone](336) [phone]   [facility] [address], [place], [place] (458) [phone](336) [phone] Insurance/Medicaid/sponsorship through [facility] [address]                                    [place], [place] ([phone](336) [phone] Therapy/tele-psych/case    [facility] [address]   [place], [place] [phone]) [phone]    Dr. Lolly MustacheArfeen  [phone](336) [phone]   [facility] [place] [facility]  [employer] [facility] 1) [address], [place] 2) [address], [place] 3)  [address], [place] [phone](336) [phone](336) [phone]  [phone](336) [phone]   [facility] Child Abuse [facility] (430)504-3318(336) 253-150-3679 or 214-486-0351(336) [phone] (After Hours)      Take the prescriptions as directed.  Call your regular medical doctor tomorrow to schedule a follow up appointment within the next 2 days. Call the ENT doctor and the Neurologist tomorrow to schedule a follow up appointment within the next week.  Return to the Emergency Department immediately sooner if worsening.

## 2014-01-15 NOTE — ED Notes (Signed)
CT made aware of PT's new IV to RAC and 20G appropriate for angio.

## 2014-01-15 NOTE — ED Notes (Signed)
Patient refused to stand for orthostatic v/s

## 2014-01-15 NOTE — ED Provider Notes (Signed)
CSN: [id number]     Arrival date & time 01/15/14  1333 History   First MD Initiated Contact with Patient 01/15/14 1356     Chief Complaint  Patient presents with  . Dizziness      HPI Pt was seen at 1355. Per pt and his wife, c/o sudden onset and persistence of constant "dizziness" that began PTA. Pt's wife stated he ate lunch, sat in a chair in another room, then stood up and said he "was dizzy."  Pt's wife states pt "then had a panic attack." Describes the dizziness as "everything is spinning around." Has been associated with several intermittent episodes of N/V. Symptoms worsen with turning his head side-to-side, body position changes, and walking. Symptoms improve when he lays still with his eyes closed. Endorses hx of same symptoms several years ago, dx vertigo. EMS gave IV zofran en route. Pt states he has not taken his BP meds today. Denies fevers, no neck pain, no CP/palpitations, no SOB/cough, no abd pain, no diarrhea, no focal motor weakness, no tingling/numbness in extremities, no slurred speech, no facial droop, no syncope/near syncope.    Past Medical History  Diagnosis Date  . Diabetes mellitus   . Hypertension   . Hyperlipidemia   . Agent orange exposure   . Coronary artery disease   . PTSD (post-traumatic stress disorder)   . Myocardial infarction 2009    X3 -stent placed and pacemaker (2011)  . Headache   . Vertigo    Past Surgical History  Procedure Laterality Date  . Pacemaker insertion    . Back surgery    . Carotid stent    . Knee surgery      History  Substance Use Topics  . Smoking status: Former Smoker -- 3.00 packs/day for 40 years    Types: Cigarettes    Quit date: 01/03/2007  . Smokeless tobacco: Former Neurosurgeon    Types: Chew, Snuff    Quit date: 01/03/2011  . Alcohol Use: No    Review of Systems ROS: Statement: All systems negative except as marked or noted in the HPI; Constitutional: Negative for fever and chills. ; ; Eyes: Negative for eye  pain, redness and discharge. ; ; ENMT: Negative for ear pain, hoarseness, nasal congestion, sinus pressure and sore throat. ; ; Cardiovascular: Negative for chest pain, palpitations, diaphoresis, dyspnea and peripheral edema. ; ; Respiratory: Negative for cough, wheezing and stridor. ; ; Gastrointestinal: +N/V. Negative for diarrhea, abdominal pain, blood in stool, hematemesis, jaundice and rectal bleeding. . ; ; Genitourinary: Negative for dysuria, flank pain and hematuria. ; ; Musculoskeletal: Negative for back pain and neck pain. Negative for swelling and trauma.; ; Skin: Negative for pruritus, rash, abrasions, blisters, bruising and skin lesion.; ; Neuro: +"Dizziness." Negative for headache, lightheadedness and neck stiffness. Negative for weakness, altered level of consciousness , altered mental status, extremity weakness, paresthesias, involuntary movement, seizure and syncope.     Allergies  Review of patient's allergies indicates no known allergies.  Home Medications   Prior to Admission medications   Medication Sig Start Date End Date Taking? Authorizing Provider  acarbose (PRECOSE) 25 MG tablet Take 50 mg by mouth 2 (two) times daily.    Historical Provider, MD  albuterol (PROVENTIL HFA;VENTOLIN HFA) 108 (90 BASE) MCG/ACT inhaler Inhale 1-2 puffs into the lungs every 6 (six) hours as needed for wheezing or shortness of breath.    Historical Provider, MD  aspirin EC 81 MG tablet Take 81 mg by mouth every morning.  Historical Provider, MD  Aspirin-Acetaminophen-Caffeine (GOODY HEADACHE PO) Take 1 packet by mouth daily as needed (for headache and pain).    Historical Provider, MD  Cyanocobalamin (VITAMIN B 12 PO) Take 1,000 mcg by mouth every evening.     Historical Provider, MD  HYDROcodone-acetaminophen (NORCO/VICODIN) 5-325 MG per tablet Take one-two tabs po q 4-6 hrs prn pain 08/04/13   Tammy L. Triplett, PA-C  ibuprofen (ADVIL,MOTRIN) 600 MG tablet Take 1 tablet (600 mg total) by mouth 3  (three) times daily. Take with food 08/04/13   Tammy L. Triplett, PA-C  lisinopril (PRINIVIL,ZESTRIL) 40 MG tablet Take 40 mg by mouth every morning.    Historical Provider, MD  methocarbamol (ROBAXIN) 500 MG tablet Take 1 tablet (500 mg total) by mouth 3 (three) times daily. 08/04/13   Tammy L. Triplett, PA-C  Multiple Vitamin (MULTIVITAMIN WITH MINERALS) TABS tablet Take 1 tablet by mouth every morning.     Historical Provider, MD  Omega-3 Fatty Acids (FISH OIL PO) Take 2,000 mg by mouth 2 (two) times daily.     Historical Provider, MD  omeprazole (PRILOSEC) 20 MG capsule Take 20 mg by mouth every morning.    Historical Provider, MD  simvastatin (ZOCOR) 80 MG tablet Take 40 mg by mouth at bedtime.    Historical Provider, MD  Vitamin D, Ergocalciferol, (DRISDOL) 50000 UNITS CAPS capsule Take 50,000 Units by mouth every Saturday. On saturdays    Historical Provider, MD   BP 183/93 mmHg  Pulse 79  Temp(Src) 97.5 F (36.4 C) (Oral)  Resp 18  Ht 5' 8.5" (1.74 m)  Wt 192 lb (87.091 kg)  BMI 28.77 kg/m2  SpO2 100% Physical Exam 1400: Physical examination:  Nursing notes reviewed; Vital signs and O2 SAT reviewed;  Constitutional: Well developed, Well nourished, Well hydrated, Anxious.; Head:  Normocephalic, atraumatic; Eyes: EOMI, PERRL, No scleral icterus; ENMT: TM's clear bilat. +edemetous nasal turbinates bilat with clear rhinorrhea. Mouth and pharynx normal, Mucous membranes moist; Neck: Supple, Full range of motion, No lymphadenopathy; Cardiovascular: Regular rate and rhythm, No murmur, rub, or gallop; Respiratory: Breath sounds clear & equal bilaterally, No rales, rhonchi, wheezes. Intermittently hyperventilating during exam. Speaking full sentences with ease, Normal respiratory effort/excursion; Chest: Nontender, Movement normal; Abdomen: Soft, Nontender, Nondistended, Normal bowel sounds; Genitourinary: No CVA tenderness; Extremities: Pulses normal, No tenderness, No edema, No calf edema or  asymmetry.; Neuro: AA&Ox3, Major CN grossly intact. Speech clear.  No facial droop.  +right horizontal end gaze fatigable nystagmus which reproduces pt's symptoms. Grips equal. Strength 5/5 equal bilat UE's and LE's.  DTR 2/4 equal bilat UE's and LE's.  No gross sensory deficits.  Normal cerebellar testing bilat UE's (finger-nose) and LE's (heel-shin).; Skin: Color normal, Warm, Dry.; Psych:  Anxious.    ED Course  Procedures     EKG Interpretation   Date/Time:  Thursday January 15 2014 13:37:06 EST Ventricular Rate:  61 PR Interval:  128 QRS Duration: 130 QT Interval:  460 QTC Calculation: 463 R Axis:   75 Text Interpretation:  Sinus rhythm Right bundle branch block When compared  with ECG of 03/04/2013 Atrial-paced rhythm was present Confirmed by [facility]   MD, Nicholos Johns [phone]) on 01/15/2014 2:11:10 PM      MDM  MDM Reviewed: previous chart, nursing note and vitals Reviewed previous: labs, ECG and CT scan Interpretation: labs, ECG, x-ray and CT scan     Results for orders placed or performed during the hospital encounter of 01/15/14  CBC with Differential  Result Value  Ref Range   WBC 5.8 4.0 - 10.5 K/uL   RBC 4.21 (L) 4.22 - 5.81 MIL/uL   Hemoglobin 13.0 13.0 - 17.0 g/dL   HCT 16.1 (L) 09.6 - 04.5 %   MCV 91.4 78.0 - 100.0 fL   MCH 30.9 26.0 - 34.0 pg   MCHC 33.8 30.0 - 36.0 g/dL   RDW 40.9 81.1 - 91.4 %   Platelets 141 (L) 150 - 400 K/uL   Neutrophils Relative % 47 43 - 77 %   Neutro Abs 2.7 1.7 - 7.7 K/uL   Lymphocytes Relative 40 12 - 46 %   Lymphs Abs 2.3 0.7 - 4.0 K/uL   Monocytes Relative 11 3 - 12 %   Monocytes Absolute 0.6 0.1 - 1.0 K/uL   Eosinophils Relative 2 0 - 5 %   Eosinophils Absolute 0.1 0.0 - 0.7 K/uL   Basophils Relative 0 0 - 1 %   Basophils Absolute 0.0 0.0 - 0.1 K/uL  Comprehensive metabolic panel  Result Value Ref Range   Sodium 137 135 - 145 mmol/L   Potassium 3.7 3.5 - 5.1 mmol/L   Chloride 108 96 - 112 mEq/L   CO2 23 19 - 32 mmol/L    Glucose, Bld 164 (H) 70 - 99 mg/dL   BUN 21 6 - 23 mg/dL   Creatinine, Ser 7.82 0.50 - 1.35 mg/dL   Calcium 8.7 8.4 - 95.6 mg/dL   Total Protein 7.2 6.0 - 8.3 g/dL   Albumin 4.0 3.5 - 5.2 g/dL   AST 23 0 - 37 U/L   ALT 27 0 - 53 U/L   Alkaline Phosphatase 65 39 - 117 U/L   Total Bilirubin 0.7 0.3 - 1.2 mg/dL   GFR calc non Af Amer 86 (L) >90 mL/min   GFR calc Af Amer >90 >90 mL/min   Anion gap 6 5 - 15  Troponin I  Result Value Ref Range   Troponin I <0.03 <0.031 ng/mL   Ct Angio Head W/cm &/or Wo Cm 01/15/2014   CLINICAL DATA:  Hypertension.  Dizziness.  EXAM: CT ANGIOGRAPHY HEAD  TECHNIQUE: Multidetector CT imaging of the head was performed using the standard protocol during bolus administration of intravenous contrast. Multiplanar CT image reconstructions and MIPs were obtained to evaluate the vascular anatomy.  CONTRAST:  Omnipaque 300, 80 mL.  COMPARISON:  Noncontrast CT head earlier in the day.  FINDINGS: CT HEAD  Calvarium and skull base: No fracture or destructive lesion. Mastoids and middle ears are clear.  Paranasal sinuses: Significant BILATERAL maxillary sinus fluid, near complete opacity. Minor changes in the ethmoids. Frontal and sphenoid sinuses clear.  Orbits: Negative.  Brain: No evidence of acute abnormality, including acute infarct, hemorrhage, hydrocephalus, or mass lesion.  CTA HEAD  Anterior circulation: Less than 50% stenosis of the LEFT ICA in its cavernous segment due to calcific plaque. No similar changes on the RIGHT. ICA termini are widely patent. No significant stenosis, proximal occlusion, aneurysm, or vascular malformation.  Posterior circulation: The basilar, and both vertebrals, are relatively small. This is due to BILATERAL fetal PCA origins from the internal carotid arteries. The LEFT vertebral is dominant. Non stenotic calcific changes distal V4 segment LEFT vertebral.  Venous sinuses: As permitted by contrast timing, patent.  Anatomic variants: BILATERAL fetal  PCA.  Delayed phase:   No abnormal intracranial enhancement.  IMPRESSION: No flow-limiting  stenosis or occlusion.  No abnormal postcontrast enhancement.  Significant BILATERAL maxillary sinus disease.   Electronically Signed  By: Davonna BellingJohn  Curnes M.D.   On: 01/15/2014 19:10   Dg Chest 2 View 01/15/2014   CLINICAL DATA:  Dizziness, nausea and vomiting, confusion, and hypertension.  EXAM: CHEST  2 VIEW  COMPARISON:  Chest CT 03/04/2013 and chest radiographs 03/03/2013  FINDINGS: Left-sided dual lead pacemaker remains in place. Lung volumes are low with accentuation of the cardiac silhouette. Minimal opacities in the lung bases likely represent atelectasis. No segmental airspace consolidation, pulmonary edema, pleural effusion, or pneumothorax is identified. Multilevel thoracic osteophytosis is noted.  IMPRESSION: Low lung volumes with bibasilar atelectasis.   Electronically Signed   By: Sebastian AcheAllen  Grady   On: 01/15/2014 14:53   Ct Head Wo Contrast 01/15/2014   CLINICAL DATA:  Vomiting and weakness for 2 hr.  EXAM: CT HEAD WITHOUT CONTRAST  TECHNIQUE: Contiguous axial images were obtained from the base of the skull through the vertex without intravenous contrast.  COMPARISON:  CT head without contrast 09/09/2009  FINDINGS: No acute infarct, hemorrhage, or mass lesion is present. The ventricles are of normal size. No significant extraaxial fluid collection is present.  Chronic opacification of the maxillary sinuses is again noted. The remaining paranasal sinuses and the mastoid air cells are clear. The calvarium is intact. Atherosclerotic changes are noted within the cavernous internal carotid arteries.  IMPRESSION: 1. Normal CT appearance of the brain for age. 2. Chronic bilateral maxillary sinus opacification   Electronically Signed   By: Gennette Pachris  Mattern M.D.   On: 01/15/2014 15:03    1915:  Pt c/o dizziness and "confusion" with EMS providers, but since arrival to the ED he has been able to recite phone numbers,  addresses, use cellphone, etc, with ease. No improvement of symptoms after 1st dose of IV phenergan, so CT-A head ordered. Workup reassuring. Pt now feels better after 2nd dose of IV phenergan and has been able to ambulate with steady gait, easy resps NAD. Has been able to tol PO well without N/V.  States he wants to go home now. Dx and testing d/w pt and family.  Questions answered.  Verb understanding, agreeable to d/c home with outpt f/u.     Samuel JesterKathleen Taffany Heiser, DO 01/19/14 [phone]

## 2014-01-15 NOTE — ED Notes (Signed)
CT to be done in approximately per CT staff.

## 2014-01-15 NOTE — ED Notes (Signed)
Has been up to bathroom w/o nausea or dizziness

## 2014-01-15 NOTE — ED Notes (Signed)
PT stated he got dizzy and began with nausea and vomiting. PT states he feels confused. CBG with EMS 177 and 4mg  IV zofran given with EMS. PT was hypertensive with EMS and reported no bp meds today.

## 2015-01-27 ENCOUNTER — Encounter (HOSPITAL_COMMUNITY): Payer: Self-pay | Admitting: Emergency Medicine

## 2015-01-27 ENCOUNTER — Emergency Department (HOSPITAL_COMMUNITY)
Admission: EM | Admit: 2015-01-27 | Discharge: 2015-01-27 | Disposition: A | Discharge status: Home / Self Care | Payer: Non-veteran care | Attending: Emergency Medicine | Admitting: Emergency Medicine

## 2015-01-27 ENCOUNTER — Emergency Department (HOSPITAL_COMMUNITY): Payer: Non-veteran care

## 2015-01-27 DIAGNOSIS — Z7982 Long term (current) use of aspirin: Secondary | ICD-10-CM | POA: Diagnosis not present

## 2015-01-27 DIAGNOSIS — I2510 Atherosclerotic heart disease of native coronary artery without angina pectoris: Secondary | ICD-10-CM | POA: Diagnosis not present

## 2015-01-27 DIAGNOSIS — E785 Hyperlipidemia, unspecified: Secondary | ICD-10-CM | POA: Insufficient documentation

## 2015-01-27 DIAGNOSIS — R197 Diarrhea, unspecified: Secondary | ICD-10-CM | POA: Insufficient documentation

## 2015-01-27 DIAGNOSIS — Z87891 Personal history of nicotine dependence: Secondary | ICD-10-CM | POA: Diagnosis not present

## 2015-01-27 DIAGNOSIS — Z8659 Personal history of other mental and behavioral disorders: Secondary | ICD-10-CM | POA: Insufficient documentation

## 2015-01-27 DIAGNOSIS — I252 Old myocardial infarction: Secondary | ICD-10-CM | POA: Diagnosis not present

## 2015-01-27 DIAGNOSIS — Z7984 Long term (current) use of oral hypoglycemic drugs: Secondary | ICD-10-CM | POA: Diagnosis not present

## 2015-01-27 DIAGNOSIS — E119 Type 2 diabetes mellitus without complications: Secondary | ICD-10-CM | POA: Insufficient documentation

## 2015-01-27 DIAGNOSIS — Z79899 Other long term (current) drug therapy: Secondary | ICD-10-CM | POA: Diagnosis not present

## 2015-01-27 DIAGNOSIS — E860 Dehydration: Secondary | ICD-10-CM | POA: Diagnosis not present

## 2015-01-27 DIAGNOSIS — I10 Essential (primary) hypertension: Secondary | ICD-10-CM | POA: Insufficient documentation

## 2015-01-27 DIAGNOSIS — Z950 Presence of cardiac pacemaker: Secondary | ICD-10-CM | POA: Diagnosis not present

## 2015-01-27 LAB — CBC
HCT: 40 % (ref 39.0–52.0)
HEMOGLOBIN: 13.6 g/dL (ref 13.0–17.0)
MCH: 31.6 pg (ref 26.0–34.0)
MCHC: 34 g/dL (ref 30.0–36.0)
MCV: 93 fL (ref 78.0–100.0)
Platelets: 183 10*3/uL (ref 150–400)
RBC: 4.3 MIL/uL (ref 4.22–5.81)
RDW: 13.6 % (ref 11.5–15.5)
WBC: 7.5 10*3/uL (ref 4.0–10.5)

## 2015-01-27 LAB — BASIC METABOLIC PANEL
Anion gap: 9 (ref 5–15)
BUN: 18 mg/dL (ref 6–20)
CHLORIDE: 105 mmol/L (ref 101–111)
CO2: 26 mmol/L (ref 22–32)
Calcium: 9.4 mg/dL (ref 8.9–10.3)
Creatinine, Ser: 1.02 mg/dL (ref 0.61–1.24)
GFR calc non Af Amer: >60 mL/min (ref >60)
Glucose, Bld: 101 mg/dL — ABNORMAL HIGH (ref 65–99)
Potassium: 5.1 mmol/L (ref 3.5–5.1)
SODIUM: 140 mmol/L (ref 135–145)

## 2015-01-27 LAB — CBG MONITORING, ED: Glucose-Capillary: 95 mg/dL (ref 65–99)

## 2015-01-27 MED ORDER — SODIUM CHLORIDE 0.9 % IV BOLUS (SEPSIS)
1500.0000 mL | Freq: Once | INTRAVENOUS | Status: AC
  Administered 2015-01-27: 1500 mL via INTRAVENOUS

## 2015-01-27 NOTE — Discharge Instructions (Signed)

## 2015-01-27 NOTE — ED Provider Notes (Signed)
CSN: [id number]     Arrival date & time 01/27/15  1545 History   First MD Initiated Contact with Patient 01/27/15 1629     Chief Complaint  Patient presents with  . Diarrhea      HPI Patient presents to the emergency department complaining of diarrhea over the past 4-5 days.  He states diarrhea today is slowed down but is feeling slightly weak and was lightheaded earlier.  He called his doctor who recommended he come to the ER for IV fluids.  No recent sick contacts.  No recent antibiotics.  No recent travel outside the country.  Reports no blood in his stool.  Reports abdominal cramping without pain.  Denies chest pain shortness of breath.  Symptoms are mild in severity   Past Medical History  Diagnosis Date  . Diabetes mellitus   . Hypertension   . Hyperlipidemia   . Agent orange exposure   . Coronary artery disease   . PTSD (post-traumatic stress disorder)   . Myocardial infarction [facility]) 2009    X3 -stent placed and pacemaker (2011)  . Headache   . Vertigo    Past Surgical History  Procedure Laterality Date  . Pacemaker insertion    . Back surgery    . Carotid stent    . Knee surgery     No family history on file. Social History  Substance Use Topics  . Smoking status: Former Smoker -- 3.00 packs/day for 40 years    Types: Cigarettes    Quit date: 01/03/2007  . Smokeless tobacco: Former Neurosurgeon    Types: Chew, Snuff    Quit date: 01/03/2011  . Alcohol Use: No    Review of Systems  All other systems reviewed and are negative.     Allergies  Review of patient's allergies indicates no known allergies.  Home Medications   Prior to Admission medications   Medication Sig Start Date End Date Taking? Authorizing Provider  acarbose (PRECOSE) 25 MG tablet Take 50 mg by mouth 2 (two) times daily.   Yes Historical Provider, MD  aspirin 325 MG tablet Take 325 mg by mouth daily.   Yes Historical Provider, MD  atorvastatin (LIPITOR) 80 MG tablet Take 80 mg by mouth daily.     Yes Historical Provider, MD  Cyanocobalamin (VITAMIN B 12 PO) Take 1,000 mcg by mouth every evening.    Yes Historical Provider, MD  lisinopril (PRINIVIL,ZESTRIL) 40 MG tablet Take 40 mg by mouth every morning.   Yes Historical Provider, MD  metFORMIN (GLUCOPHAGE) 500 MG tablet Take 500 mg by mouth 2 (two) times daily with a meal.   Yes Historical Provider, MD  METOPROLOL TARTRATE PO Take 0.5-1 tablets by mouth 2 (two) times daily. Patient takes 1/2 tablet in the morning and 1 tablet in the evening   Yes Historical Provider, MD  Multiple Vitamin (MULTIVITAMIN WITH MINERALS) TABS tablet Take 1 tablet by mouth every morning.    Yes Historical Provider, MD  Omega-3 Fatty Acids (FISH OIL PO) Take 2,000 mg by mouth 2 (two) times daily.    Yes Historical Provider, MD  omeprazole (PRILOSEC) 20 MG capsule Take 20 mg by mouth every morning.   Yes Historical Provider, MD  albuterol (PROVENTIL HFA;VENTOLIN HFA) 108 (90 BASE) MCG/ACT inhaler Inhale 1-2 puffs into the lungs every 6 (six) hours as needed for wheezing or shortness of breath.    Historical Provider, MD  Aspirin-Acetaminophen-Caffeine (GOODY HEADACHE PO) Take 1 packet by mouth daily as needed (for headache  and pain).    Historical Provider, MD  meclizine (ANTIVERT) 25 MG tablet Take 1 tablet (25 mg total) by mouth 3 (three) times daily as needed for dizziness. 01/15/14   Samuel Jester, DO  ondansetron (ZOFRAN ODT) 4 MG disintegrating tablet Take 1 tablet (4 mg total) by mouth every 8 (eight) hours as needed for nausea or vomiting. 01/15/14   Samuel Jester, DO   BP 104/80 mmHg  Pulse 83  Temp(Src) 98.1 F (36.7 C) (Oral)  Resp 16  Ht 5' 8.5" (1.74 m)  Wt 187 lb 8 oz (85.049 kg)  BMI 28.09 kg/m2  SpO2 98% Physical Exam  Constitutional: He is oriented to person, place, and time. He appears well-developed and well-nourished.  HENT:  Head: Normocephalic and atraumatic.  Eyes: EOM are normal.  Neck: Normal range of motion.   Cardiovascular: Normal rate, regular rhythm, normal heart sounds and intact distal pulses.   Pulmonary/Chest: Effort normal and breath sounds normal. No respiratory distress.  Abdominal: Soft. He exhibits no distension. There is no tenderness.  Musculoskeletal: Normal range of motion.  Neurological: He is alert and oriented to person, place, and time.  Skin: Skin is warm and dry.  Psychiatric: He has a normal mood and affect. Judgment normal.  Nursing note and vitals reviewed.   ED Course  Procedures (including critical care time) Labs Review Labs Reviewed  BASIC METABOLIC PANEL - Abnormal; Notable for the following:    Glucose, Bld 101 (*)    All other components within normal limits  C DIFFICILE QUICK SCREEN W PCR REFLEX  CBC  CBG MONITORING, ED    Imaging Review Dg Abd Acute W/chest  01/27/2015  CLINICAL DATA:  Constipation EXAM: DG ABDOMEN ACUTE W/ 1V CHEST COMPARISON:  01/15/2014 FINDINGS: Do lead left subclavian pacemaker device is stable. Leads are intact. Normal heart size. No disproportionate dilatation of bowel. No free intraperitoneal gas. Calcifications project over the pelvis and are most likely phleboliths. IMPRESSION: No active cardiopulmonary disease. Nonobstructive bowel gas pattern. Electronically Signed   By: Jolaine Click M.D.   On: 01/27/2015 17:42   I have personally reviewed and evaluated these images and lab results as part of my medical decision-making.   EKG Interpretation None      MDM   Final diagnoses:  Diarrhea, unspecified type  Dehydration    7:07 PM Patient feels better after IV fluids at this time.  Likely mild dehydration/volume depletion.  Abdominal exam is benign.  Labs without significant abnormality.  Vital signs normal.  Discharge home in good condition.  Primary care follow-up.  He understands to return to the ER for new or worsening symptoms.    Azalia Bilis, MD 01/27/15 [phone]

## 2015-01-27 NOTE — ED Notes (Signed)
Pt reports being constipated 5 days ago. Pt states he took a laxative and has had non-stop diarrhea and abdominal cramping since. Pt states he has only had 1 episode of diarrhea today, but yesterday he had 5 in an hour. Pt also reports feeling dizzy sometimes. AOx4

## 2015-10-06 ENCOUNTER — Ambulatory Visit: Payer: Non-veteran care | Admitting: Podiatry

## 2015-10-08 ENCOUNTER — Telehealth: Payer: Self-pay | Admitting: *Deleted

## 2015-10-08 NOTE — Telephone Encounter (Signed)
Return fax [facility] Appt Confirmation and Medical Documentation Reminder, with reschedule date 10/13/2015 @1 :30pm.

## 2015-10-13 ENCOUNTER — Encounter: Payer: Self-pay | Admitting: Podiatry

## 2015-10-13 ENCOUNTER — Ambulatory Visit (INDEPENDENT_AMBULATORY_CARE_PROVIDER_SITE_OTHER): Payer: No Typology Code available for payment source | Admitting: Podiatry

## 2015-10-13 VITALS — BP 112/81 | HR 69 | Resp 16 | Ht 67.0 in | Wt 194.0 lb

## 2015-10-13 DIAGNOSIS — M79674 Pain in right toe(s): Secondary | ICD-10-CM | POA: Diagnosis not present

## 2015-10-13 DIAGNOSIS — M79675 Pain in left toe(s): Secondary | ICD-10-CM

## 2015-10-13 DIAGNOSIS — L600 Ingrowing nail: Secondary | ICD-10-CM

## 2015-10-13 NOTE — Patient Instructions (Signed)

## 2015-10-13 NOTE — Progress Notes (Signed)
   Subjective:    Patient ID: Marcus Patterson, male    DOB: 12/31/1945, 70 y.o.   MRN: [id number]  HPI    Review of Systems  Respiratory: Positive for chest tightness and shortness of breath.   Gastrointestinal: Positive for constipation.  Endocrine: Positive for polydipsia.  Musculoskeletal: Positive for back pain.  Neurological: Positive for dizziness, weakness, light-headedness and numbness.  Hematological: Bruises/bleeds easily.  Psychiatric/Behavioral: Positive for confusion. The patient is nervous/anxious.   All other systems reviewed and are negative.      Objective:   Physical Exam        Assessment & Plan:

## 2015-10-14 NOTE — Progress Notes (Signed)
Patient ID: Marcus Patterson, male   DOB: [birth year], 70 y.o.   MRN: [id number] Subjective: 70 year old male presents to the office today for evaluation of painful bilateral great toenails. Patient states despite keeping them trimmed he's unable to ambulate without pain. Patient presents today for further treatment and evaluation.  Objective:  General: Well developed, nourished, in no acute distress, alert and oriented x3   Dermatology: Skin is warm, dry and supple bilateral. Bilateral great toenails appears to be significantly incurvated to the lateral aspects of the nails bilaterally. The remaining nails appear unremarkable at this time. There are no open sores, lesions.  Vascular: Dorsalis Pedis artery and Posterior Tibial artery pedal pulses palpable. No lower extremity edema noted.   Neruologic: Grossly intact via light touch bilateral.  Musculoskeletal: Significant pain on direct palpation to the great toenails of the bilateral great toes. Muscular strength within normal limits in all groups bilateral. Normal range of motion noted to all pedal and ankle joints.   Assesement: #1 painful toenails bilateral great toes. #2 pain in bilateral great toes #3 dystrophic nails bilateral great toes   Plan of Care:  1. Patient evaluated.  2. Discussed treatment alternatives and plan of care. Explained nail avulsion procedure and post procedure course to patient. 3. Patient opted for permanent total nail avulsion of bilateral great toenails.  4. Prior to procedure, local anesthesia infiltration utilized using 3 ml of a 50:50 mixture of 2% plain lidocaine and 0.5% plain marcaine in a normal hallux block fashion and a betadine prep performed.  5. Partial permanent nail avulsion with chemical matrixectomy performed using 3x30sec applications of phenol followed by alcohol flush.  6. Light dressing applied. 7. Return to clinic in 2 weeks.   Dr. Felecia ShellingBrent M. Vanda Waskey [facility]

## 2015-10-15 ENCOUNTER — Telehealth: Payer: Self-pay | Admitting: *Deleted

## 2015-10-15 NOTE — Telephone Encounter (Signed)
Return faxed [facility] Appointment Confirmation and Medical Documentation Reminder, with confirmation of 10/13/2015 appt kept, follow up appt 10/27/2015 at 8:15am, and 10/13/2015 clinicals faxed.

## 2015-10-25 ENCOUNTER — Telehealth: Payer: Self-pay | Admitting: Podiatry

## 2015-10-25 NOTE — Telephone Encounter (Signed)
Patient called the on-call number Saturday at 9:31 PM stating that he was having burning to his toes is been ongoing for 2 days. He has tried some Epsom salts which made it worse he's been soaking in antibacterial soap which is also been making her toes turn. He states that the toes are red. He denies any drainage or pus. Denies any fevers, chills, nausea, vomiting. [facility] after get off of that with him. Discussed with him that there is any signs or symptoms of worsening infection of the ER. I'll have the office follow-up with him on Monday.  Patient then called back Saturday morning at 10:32 AM stating that the antibiotic was not called in and he is a lot of burning pain to his toes and his concern for infection. I called and spoke with the pharmacist and they stated that there was an issue with her voicemail system and I verbally called in Keflex again. He had no other concerns.

## 2015-10-25 NOTE — Telephone Encounter (Signed)
Okay, thanks

## 2015-10-27 ENCOUNTER — Ambulatory Visit (INDEPENDENT_AMBULATORY_CARE_PROVIDER_SITE_OTHER): Payer: No Typology Code available for payment source | Admitting: Podiatry

## 2015-10-27 DIAGNOSIS — S91109D Unspecified open wound of unspecified toe(s) without damage to nail, subsequent encounter: Secondary | ICD-10-CM

## 2015-10-27 DIAGNOSIS — L03039 Cellulitis of unspecified toe: Secondary | ICD-10-CM

## 2015-10-27 DIAGNOSIS — L89891 Pressure ulcer of other site, stage 1: Secondary | ICD-10-CM | POA: Diagnosis not present

## 2015-10-27 DIAGNOSIS — M79676 Pain in unspecified toe(s): Secondary | ICD-10-CM

## 2015-10-28 NOTE — Progress Notes (Signed)
Subjective: Patient presents today 2 weeks post permanent nail avulsion procedure. Patient states that the toe and nail fold is feeling much better.  Objective: Skin is warm, dry and supple. Nail and respective nail fold appears to be healing appropriately. Open wound to the associated nail fold with a granular wound base and moderate amount of fibrotic tissue. Minimal drainage noted. Mild erythema around the periungual region likely due to phenol chemical matricectomy.  Assessment: #1 postop permanent partial nail avulsion #2 open wound periungual nail fold of respective digit.   Plan of care: #1 patient was evaluated  #2 debridement of open wound was performed to the periungual border of the respective toe using a currette. Antibiotic ointment and Band-Aid was applied. #3 patient is to return to clinic on a PRN  basis.

## 2016-01-18 ENCOUNTER — Emergency Department (HOSPITAL_COMMUNITY)
Admission: EM | Admit: 2016-01-18 | Discharge: 2016-01-18 | Disposition: A | Discharge status: Home / Self Care | Payer: Non-veteran care | Attending: Emergency Medicine | Admitting: Emergency Medicine

## 2016-01-18 ENCOUNTER — Encounter (HOSPITAL_COMMUNITY): Payer: Self-pay | Admitting: Emergency Medicine

## 2016-01-18 DIAGNOSIS — E119 Type 2 diabetes mellitus without complications: Secondary | ICD-10-CM | POA: Insufficient documentation

## 2016-01-18 DIAGNOSIS — Z79899 Other long term (current) drug therapy: Secondary | ICD-10-CM | POA: Insufficient documentation

## 2016-01-18 DIAGNOSIS — Z950 Presence of cardiac pacemaker: Secondary | ICD-10-CM | POA: Insufficient documentation

## 2016-01-18 DIAGNOSIS — Z7982 Long term (current) use of aspirin: Secondary | ICD-10-CM | POA: Insufficient documentation

## 2016-01-18 DIAGNOSIS — M7989 Other specified soft tissue disorders: Secondary | ICD-10-CM | POA: Diagnosis present

## 2016-01-18 DIAGNOSIS — Z7984 Long term (current) use of oral hypoglycemic drugs: Secondary | ICD-10-CM | POA: Insufficient documentation

## 2016-01-18 DIAGNOSIS — I2510 Atherosclerotic heart disease of native coronary artery without angina pectoris: Secondary | ICD-10-CM | POA: Diagnosis not present

## 2016-01-18 DIAGNOSIS — L03113 Cellulitis of right upper limb: Secondary | ICD-10-CM | POA: Diagnosis not present

## 2016-01-18 DIAGNOSIS — Z87891 Personal history of nicotine dependence: Secondary | ICD-10-CM | POA: Diagnosis not present

## 2016-01-18 DIAGNOSIS — I10 Essential (primary) hypertension: Secondary | ICD-10-CM | POA: Insufficient documentation

## 2016-01-18 LAB — I-STAT CHEM 8, ED
BUN: 20 mg/dL (ref 6–20)
BUN: 20 mg/dL (ref 6–20)
CALCIUM ION: 1.22 mmol/L (ref 1.15–1.40)
CALCIUM ION: 1.22 mmol/L (ref 1.15–1.40)
CHLORIDE: 102 mmol/L (ref 101–111)
Chloride: 102 mmol/L (ref 101–111)
Creatinine, Ser: 1 mg/dL (ref 0.61–1.24)
Creatinine, Ser: 1 mg/dL (ref 0.61–1.24)
GLUCOSE: 190 mg/dL — AB (ref 65–99)
Glucose, Bld: 190 mg/dL — ABNORMAL HIGH (ref 65–99)
HCT: 44 % (ref 39.0–52.0)
HCT: 44 % (ref 39.0–52.0)
Hemoglobin: 15 g/dL (ref 13.0–17.0)
Hemoglobin: 15 g/dL (ref 13.0–17.0)
Potassium: 3.8 mmol/L (ref 3.5–5.1)
Potassium: 3.8 mmol/L (ref 3.5–5.1)
Sodium: 141 mmol/L (ref 135–145)
Sodium: 141 mmol/L (ref 135–145)
TCO2: 26 mmol/L (ref 0–100)
TCO2: 26 mmol/L (ref 0–100)

## 2016-01-18 LAB — CBC WITH DIFFERENTIAL/PLATELET
Basophils Absolute: 0 10*3/uL (ref 0.0–0.1)
Basophils Relative: 0 %
EOS ABS: 0.1 10*3/uL (ref 0.0–0.7)
Eosinophils Relative: 2 %
HCT: 42.2 % (ref 39.0–52.0)
HEMOGLOBIN: 14.7 g/dL (ref 13.0–17.0)
LYMPHS PCT: 33 %
Lymphs Abs: 2.6 10*3/uL (ref 0.7–4.0)
MCH: 32.3 pg (ref 26.0–34.0)
MCHC: 34.8 g/dL (ref 30.0–36.0)
MCV: 92.7 fL (ref 78.0–100.0)
Monocytes Absolute: 0.7 10*3/uL (ref 0.1–1.0)
Monocytes Relative: 9 %
NEUTROS PCT: 57 %
Neutro Abs: 4.6 10*3/uL (ref 1.7–7.7)
Platelets: 163 10*3/uL (ref 150–400)
RBC: 4.55 MIL/uL (ref 4.22–5.81)
RDW: 13 % (ref 11.5–15.5)
WBC: 8 10*3/uL (ref 4.0–10.5)

## 2016-01-18 LAB — I-STAT CG4 LACTIC ACID, ED
LACTIC ACID, VENOUS: 2.62 mmol/L — AB (ref 0.5–1.9)
Lactic Acid, Venous: 2.62 mmol/L (ref 0.5–1.9)

## 2016-01-18 MED ORDER — SULFAMETHOXAZOLE-TRIMETHOPRIM 800-160 MG PO TABS: 1.0000 | ORAL_TABLET | Freq: Two times a day (BID) | ORAL | 0 refills | Status: AC

## 2016-01-18 MED ORDER — SULFAMETHOXAZOLE-TRIMETHOPRIM 800-160 MG PO TABS
1.0000 | ORAL_TABLET | Freq: Once | ORAL | Status: AC
  Administered 2016-01-18: 1 via ORAL
  Filled 2016-01-18: qty 1

## 2016-01-18 NOTE — ED Triage Notes (Addendum)
Pt reports knee surgery Friday and had right hand swelling since. This was the site of his IV for surgery.  Pulses present, cap refill less than 3 seconds.

## 2016-01-18 NOTE — ED Provider Notes (Signed)
[facility] DEPT Provider Note   CSN: [id number] Arrival date & time: 01/18/16  1100   By signing my name below, I, Cynda Acres, attest that this documentation has been prepared under the direction and in the presence of Gerhard Munch, MD. Electronically Signed: Cynda Acres, Scribe. 01/18/16. 12:30 PM.  History   Chief Complaint Chief Complaint  Patient presents with  . Joint Swelling    HPI Comments: Marcus Patterson is a 71 y.o. male who presents to the Emergency Department complaining of constant hand swelling that began after surgery 6 days ago. Patient states he had knee surgery 5 days ago, he had right hand swelling since then. This was the site of the patients IV during surgery. Patient states his surgeon called him and referred him to the emergency department. Patient has associated nausea, weakness, and tingling. No modifying factors indicated. He denies any knee problems, vomiting, or syncope.    The history is provided by the patient. No language interpreter was used.    Past Medical History:  Diagnosis Date  . Agent orange exposure   . Coronary artery disease   . Diabetes mellitus   . Headache   . Hyperlipidemia   . Hypertension   . Myocardial infarction 2009   X3 -stent placed and pacemaker (2011)  . PTSD (post-traumatic stress disorder)   . Vertigo     Patient Active Problem List   Diagnosis Date Noted  . Dyspnea on exertion 03/03/2013  . Diabetes (HCC) 03/03/2013  . Essential hypertension, benign 03/03/2013  . Other and unspecified hyperlipidemia 03/03/2013  . CAD (coronary artery disease) 03/03/2013  . Other malaise and fatigue 03/03/2013  . DOE (dyspnea on exertion) 03/03/2013    Past Surgical History:  Procedure Laterality Date  . BACK SURGERY    . CAROTID STENT    . KNEE SURGERY    . PACEMAKER INSERTION         Home Medications    Prior to Admission medications   Medication Sig Start Date End Date Taking? Authorizing Provider    acarbose (PRECOSE) 25 MG tablet Take 50 mg by mouth 2 (two) times daily.    Historical Provider, MD  albuterol (PROVENTIL HFA;VENTOLIN HFA) 108 (90 BASE) MCG/ACT inhaler Inhale 1-2 puffs into the lungs every 6 (six) hours as needed for wheezing or shortness of breath.    Historical Provider, MD  aspirin 325 MG tablet Take 325 mg by mouth daily.    Historical Provider, MD  Aspirin-Acetaminophen-Caffeine (GOODY HEADACHE PO) Take 1 packet by mouth daily as needed (for headache and pain).    Historical Provider, MD  atorvastatin (LIPITOR) 80 MG tablet Take 80 mg by mouth daily.     Historical Provider, MD  Cyanocobalamin (VITAMIN B 12 PO) Take 1,000 mcg by mouth every evening.     Historical Provider, MD  lisinopril (PRINIVIL,ZESTRIL) 40 MG tablet Take 40 mg by mouth every morning.    Historical Provider, MD  metFORMIN (GLUCOPHAGE) 500 MG tablet Take 500 mg by mouth 2 (two) times daily with a meal.    Historical Provider, MD  METOPROLOL TARTRATE PO Take 0.5-1 tablets by mouth 2 (two) times daily. Patient takes 1/2 tablet in the morning and 1 tablet in the evening    Historical Provider, MD  Multiple Vitamin (MULTIVITAMIN WITH MINERALS) TABS tablet Take 1 tablet by mouth every morning.     Historical Provider, MD  nitroGLYCERIN (NITROSTAT) 0.4 MG SL tablet Place 0.4 mg under the tongue every 5 (five) minutes  as needed for chest pain.    Historical Provider, MD  Omega-3 Fatty Acids (FISH OIL PO) Take 2,000 mg by mouth 2 (two) times daily.     Historical Provider, MD  omeprazole (PRILOSEC) 20 MG capsule Take 20 mg by mouth every morning.    Historical Provider, MD    Family History History reviewed. No pertinent family history.  Social History Social History  Substance Use Topics  . Smoking status: Former Smoker    Packs/day: 3.00    Years: 40.00    Types: Cigarettes    Quit date: 01/03/2007  . Smokeless tobacco: Former NeurosurgeonUser    Types: Chew, Snuff    Quit date: 01/03/2011  . Alcohol use No      Allergies   Patient has no known allergies.   Review of Systems Review of Systems  Constitutional:       Per HPI, otherwise negative  HENT:       Per HPI, otherwise negative  Respiratory:       Per HPI, otherwise negative  Cardiovascular:       Per HPI, otherwise negative  Gastrointestinal: Negative for vomiting.  Endocrine:       Negative aside from HPI  Genitourinary:       Neg aside from HPI   Musculoskeletal:       Per HPI, otherwise negative  Skin: Negative.  Negative for wound.  Neurological: Negative for syncope.     Physical Exam Updated Vital Signs BP 117/81 (BP Location: Left Arm)   Pulse 65   Temp 97.8 F (36.6 C) (Oral)   Resp 16   Ht 5\' 8"  (1.727 m)   Wt 196 lb (88.9 kg)   SpO2 100%   BMI 29.80 kg/m   Physical Exam  Constitutional: He is oriented to person, place, and time. He appears well-developed. No distress.  HENT:  Head: Normocephalic and atraumatic.  Eyes: Conjunctivae and EOM are normal.  Cardiovascular: Normal rate and regular rhythm.   Pulmonary/Chest: Effort normal. No stridor. No respiratory distress.  Abdominal: He exhibits no distension.  Musculoskeletal: He exhibits no edema.       Arms:      Legs: Neurological: He is alert and oriented to person, place, and time.  Skin: Skin is warm and dry.  Psychiatric: He has a normal mood and affect.  Nursing note and vitals reviewed.    ED Treatments / Results  DIAGNOSTIC STUDIES: Oxygen Saturation is 100% on RA, normal by my interpretation.    COORDINATION OF CARE: 12:30 PM Discussed treatment plan with pt at bedside and pt agreed to plan.  Labs (all labs ordered are listed, but only abnormal results are displayed) Labs Reviewed - No data to display  EKG  EKG Interpretation None       Radiology No results found.  Procedures Procedures (including critical care time)  Medications Ordered in ED Medications - No data to display   Initial Impression / Assessment  and Plan / ED Course  I have reviewed the triage vital signs and the nursing notes.  Pertinent labs & imaging results that were available during my care of the patient were reviewed by me and considered in my medical decision making (see chart for details).  Clinical Course     1:45 PM On repeat exam the patient is awake and alert, no new complaints. I discussed all findings, including concern for possible early infection. Patient has trace lactic acidosis, but no leukocytosis, no fever.  Are there  is some suspicion for cutaneous infection, no evidence for bacteremia, sepsis, septic shock. Patient started on Bactrim, will follow up with his surgeon the telephone in 2 days. Explicit return precautions discussed with the patient and his wife.   Final Clinical Impressions(s) / ED Diagnoses    I personally performed the services described in this documentation, which was scribed in my presence. The recorded information has been reviewed and is accurate.        Gerhard Munch, MD 01/18/16 1345

## 2016-01-18 NOTE — Discharge Instructions (Signed)
Please be sure to discuss today's evaluation with your surgeon.  Return here for concerning changes in your condition.

## 2016-09-22 ENCOUNTER — Emergency Department (HOSPITAL_COMMUNITY): Payer: Non-veteran care

## 2016-09-22 ENCOUNTER — Emergency Department (HOSPITAL_COMMUNITY)
Admission: EM | Admit: 2016-09-22 | Discharge: 2016-09-22 | Disposition: A | Discharge status: Home / Self Care | Payer: Non-veteran care | Attending: Emergency Medicine | Admitting: Emergency Medicine

## 2016-09-22 ENCOUNTER — Encounter (HOSPITAL_COMMUNITY): Payer: Self-pay | Admitting: Cardiology

## 2016-09-22 DIAGNOSIS — I10 Essential (primary) hypertension: Secondary | ICD-10-CM | POA: Insufficient documentation

## 2016-09-22 DIAGNOSIS — R079 Chest pain, unspecified: Secondary | ICD-10-CM | POA: Diagnosis present

## 2016-09-22 DIAGNOSIS — Z7984 Long term (current) use of oral hypoglycemic drugs: Secondary | ICD-10-CM | POA: Insufficient documentation

## 2016-09-22 DIAGNOSIS — Z950 Presence of cardiac pacemaker: Secondary | ICD-10-CM | POA: Insufficient documentation

## 2016-09-22 DIAGNOSIS — I2510 Atherosclerotic heart disease of native coronary artery without angina pectoris: Secondary | ICD-10-CM | POA: Diagnosis not present

## 2016-09-22 DIAGNOSIS — R42 Dizziness and giddiness: Secondary | ICD-10-CM | POA: Insufficient documentation

## 2016-09-22 DIAGNOSIS — Z87891 Personal history of nicotine dependence: Secondary | ICD-10-CM | POA: Insufficient documentation

## 2016-09-22 DIAGNOSIS — E119 Type 2 diabetes mellitus without complications: Secondary | ICD-10-CM | POA: Diagnosis not present

## 2016-09-22 DIAGNOSIS — Z7982 Long term (current) use of aspirin: Secondary | ICD-10-CM | POA: Diagnosis not present

## 2016-09-22 DIAGNOSIS — Z79899 Other long term (current) drug therapy: Secondary | ICD-10-CM | POA: Diagnosis not present

## 2016-09-22 LAB — BASIC METABOLIC PANEL
ANION GAP: 7 (ref 5–15)
BUN: 20 mg/dL (ref 6–20)
CHLORIDE: 104 mmol/L (ref 101–111)
CO2: 24 mmol/L (ref 22–32)
Calcium: 8.6 mg/dL — ABNORMAL LOW (ref 8.9–10.3)
Creatinine, Ser: 0.99 mg/dL (ref 0.61–1.24)
GFR calc non Af Amer: >60 mL/min (ref >60)
Glucose, Bld: 177 mg/dL — ABNORMAL HIGH (ref 65–99)
POTASSIUM: 4.1 mmol/L (ref 3.5–5.1)
SODIUM: 135 mmol/L (ref 135–145)

## 2016-09-22 LAB — CBC
HEMATOCRIT: 38.7 % — AB (ref 39.0–52.0)
HEMOGLOBIN: 13.5 g/dL (ref 13.0–17.0)
MCH: 32.1 pg (ref 26.0–34.0)
MCHC: 34.9 g/dL (ref 30.0–36.0)
MCV: 91.9 fL (ref 78.0–100.0)
Platelets: 131 10*3/uL — ABNORMAL LOW (ref 150–400)
RBC: 4.21 MIL/uL — ABNORMAL LOW (ref 4.22–5.81)
RDW: 13.5 % (ref 11.5–15.5)
WBC: 5.4 10*3/uL (ref 4.0–10.5)

## 2016-09-22 LAB — TROPONIN I

## 2016-09-22 LAB — I-STAT TROPONIN, ED: Troponin i, poc: 0 ng/mL (ref 0.00–0.08)

## 2016-09-22 MED ORDER — IOPAMIDOL (ISOVUE-370) INJECTION 76%
100.0000 mL | Freq: Once | INTRAVENOUS | Status: AC | PRN
  Administered 2016-09-22: 100 mL via INTRAVENOUS

## 2016-09-22 NOTE — ED Triage Notes (Signed)
Chest pain off and on since yesterday.  Pain worse last 30-40 minutes.  C/o sob.

## 2016-09-22 NOTE — ED Notes (Signed)
Patient transported to CT 

## 2016-09-22 NOTE — ED Notes (Signed)
Patient has a [employer] pacemaker.  [employer] called and stated that Marcus Patterson's pacemaker was working correctly and did not show any abnormality.   Atrially paced.

## 2016-09-22 NOTE — ED Notes (Signed)
Patient complained of dizziness and head pain upon standing for orthostatics. Patient requesting something for pain. Dr. Estell Harpin informed.

## 2016-09-22 NOTE — ED Notes (Addendum)
Patient stated that he had a blockage in a vein in the back of his head.  Patient's aspirin dosage was increased from an 81 mg to 325 mg due to the fact the vein is too small to operate.  Patient stated at times he is dizzy when he first stands up.  Patient was dizzy upon sitting but it cleared up after a couple of seconds.  Upon standing the patient he became very dizzy and stated he had a severe headache now.  Patient requesting something for his headache.    Nurse notified

## 2016-09-22 NOTE — ED Provider Notes (Signed)
[facility] Provider Note   CSN: [id number] Arrival date & time: 09/22/16  1356     History   Chief Complaint No chief complaint on file.   HPI Marcus Patterson is a 71 y.o. male.  Patient complains of some chest discomfort today he says that he has electrical feeling in his chest.   The history is provided by the patient. No language interpreter was used.  Chest Pain   This is a new problem. The current episode started 1 to 2 hours ago. The problem occurs constantly. The problem has not changed since onset.The pain is associated with walking. The pain is present in the substernal region. The pain is at a severity of 2/10. The pain is mild. The quality of the pain is described as sharp. The pain does not radiate. Associated symptoms include dizziness. Pertinent negatives include no abdominal pain, no back pain, no cough and no headaches.  Pertinent negatives for past medical history include no seizures.    Past Medical History:  Diagnosis Date  . Agent orange exposure   . Coronary artery disease   . Diabetes mellitus   . Headache   . Hyperlipidemia   . Hypertension   . Myocardial infarction [facility]) 2009   X3 -stent placed and pacemaker (2011)  . PTSD (post-traumatic stress disorder)   . Vertigo     Patient Active Problem List   Diagnosis Date Noted  . Dyspnea on exertion 03/03/2013  . Diabetes (HCC) 03/03/2013  . Essential hypertension, benign 03/03/2013  . Other and unspecified hyperlipidemia 03/03/2013  . CAD (coronary artery disease) 03/03/2013  . Other malaise and fatigue 03/03/2013  . DOE (dyspnea on exertion) 03/03/2013    Past Surgical History:  Procedure Laterality Date  . BACK SURGERY    . CAROTID STENT    . KNEE SURGERY    . PACEMAKER INSERTION         Home Medications    Prior to Admission medications   Medication Sig Start Date End Date Taking? Authorizing Provider  acarbose (PRECOSE) 25 MG tablet Take 50 mg by mouth 2 (two) times  daily.    [provider]  albuterol (PROVENTIL HFA;VENTOLIN HFA) 108 (90 BASE) MCG/ACT inhaler Inhale 1-2 puffs into the lungs every 6 (six) hours as needed for wheezing or shortness of breath.    [provider]  aspirin 325 MG tablet Take 325 mg by mouth daily.    [provider]  aspirin 81 MG chewable tablet Chew 81 mg by mouth daily.    [provider]  Aspirin-Acetaminophen-Caffeine (GOODY HEADACHE PO) Take 1 packet by mouth daily as needed (for headache and pain).    [provider]  atorvastatin (LIPITOR) 80 MG tablet Take 80 mg by mouth daily.     [provider]  Cyanocobalamin (VITAMIN B 12 PO) Take 1,000 mcg by mouth every evening.     [provider]  HYDROcodone-acetaminophen (NORCO/VICODIN) 5-325 MG tablet Take 1 tablet by mouth every 6 (six) hours as needed for moderate pain.    [provider]  lisinopril (PRINIVIL,ZESTRIL) 40 MG tablet Take 40 mg by mouth every morning.    [provider]  metFORMIN (GLUCOPHAGE) 500 MG tablet Take 500 mg by mouth 2 (two) times daily with a meal.    [provider]  METOPROLOL TARTRATE PO Take 0.5-1 tablets by mouth 2 (two) times daily. Patient takes 1/2 tablet in the morning and 1 tablet in the evening  [provider]  Multiple Vitamin (MULTIVITAMIN WITH MINERALS) TABS tablet Take 1 tablet by mouth every morning.     [provider]  nitroGLYCERIN (NITROSTAT) 0.4 MG SL tablet Place 0.4 mg under the tongue every 5 (five) minutes as needed for chest pain.    [provider]  Omega-3 Fatty Acids (FISH OIL PO) Take 2,000 mg by mouth 2 (two) times daily.     [provider]  omeprazole (PRILOSEC) 20 MG capsule Take 20 mg by mouth every morning.    [provider]    Family History History reviewed. No pertinent family history.  Social History Social History  Substance Use Topics  . Smoking status: Former Smoker     Packs/day: 3.00    Years: 40.00    Types: Cigarettes    Quit date: 01/03/2007  . Smokeless tobacco: Former Neurosurgeon    Types: Chew, Snuff    Quit date: 01/03/2011  . Alcohol use No     Allergies   Patient has no known allergies.   Review of Systems Review of Systems  Constitutional: Negative for appetite change and fatigue.  HENT: Negative for congestion, ear discharge and sinus pressure.   Eyes: Negative for discharge.  Respiratory: Negative for cough.   Cardiovascular: Positive for chest pain.  Gastrointestinal: Negative for abdominal pain and diarrhea.  Genitourinary: Negative for frequency and hematuria.  Musculoskeletal: Negative for back pain.  Skin: Negative for rash.  Neurological: Positive for dizziness. Negative for seizures and headaches.  Psychiatric/Behavioral: Negative for hallucinations.     Physical Exam Updated Vital Signs BP (!) 149/92   Pulse 60   Temp 98 F (36.7 C) (Oral)   Resp 19   Ht  (1.727 m)   Wt 82.6 kg (182 lb)   SpO2 98%   BMI 27.67 kg/m   Physical Exam  Constitutional: He is oriented to person, place, and time. He appears well-developed.  HENT:  Head: Normocephalic.  Eyes: Conjunctivae and EOM are normal. No scleral icterus.  Neck: Neck supple. No thyromegaly present.  Cardiovascular: Normal rate and regular rhythm.  Exam reveals no gallop and no friction rub.   No murmur heard. Pulmonary/Chest: No stridor. He has no wheezes. He has no rales. He exhibits no tenderness.  Abdominal: He exhibits no distension. There is no tenderness. There is no rebound.  Musculoskeletal: Normal range of motion. He exhibits no edema.  Lymphadenopathy:    He has no cervical adenopathy.  Neurological: He is oriented to person, place, and time. He exhibits normal muscle tone. Coordination normal.  Skin: No rash noted. No erythema.  Psychiatric: He has a normal mood and affect. His behavior is normal.     ED Treatments / Results  Labs (all labs  ordered are listed, but only abnormal results are displayed) Labs Reviewed  BASIC METABOLIC PANEL - Abnormal; Notable for the following:       Result Value   Glucose, Bld 177 (*)    Calcium 8.6 (*)    All other components within normal limits  CBC - Abnormal; Notable for the following:    RBC 4.21 (*)    HCT 38.7 (*)    Platelets 131 (*)    All other components within normal limits  TROPONIN I  I-STAT TROPONIN, ED    EKG  EKG Interpretation  Date/Time:  Friday September 22 2016 14:06:07 EDT Ventricular Rate:  71 PR Interval:    QRS Duration: 121 QT Interval:  400 QTC Calculation: 435  R Axis:   -29 Text Interpretation:  Atrial-paced rhythm Right bundle branch block Inferior infarct, age indeterminate Lateral leads are also involved Confirmed by Bethann Berkshire [phone]) on 09/22/2016 2:14:55 PM       Radiology Ct Angio Head W Or Wo Contrast  Result Date: 09/22/2016 CLINICAL DATA:  Headache.  Neuro deficit EXAM: CT ANGIOGRAPHY HEAD AND NECK TECHNIQUE: Multidetector CT imaging of the head and neck was performed using the standard protocol during bolus administration of intravenous contrast. Multiplanar CT image reconstructions and MIPs were obtained to evaluate the vascular anatomy. Carotid stenosis measurements (when applicable) are obtained utilizing NASCET criteria, using the distal internal carotid diameter as the denominator. CONTRAST:  100 mL Isovue 370 IV COMPARISON:  CT head 01/15/2014 FINDINGS: CT HEAD FINDINGS Brain: Ventricle size and cerebral volume normal for age. Negative for acute infarct. Negative for hemorrhage or mass. Mild chronic white matter changes. Vascular: Negative for hyperdense vessel Skull: Negative Sinuses: Mucosal edema in the paranasal sinuses.  Negative orbit. Orbits: Negative Review of the MIP images confirms the above findings CTA NECK FINDINGS Aortic arch: Minimal atherosclerotic disease in the aortic arch. Transvenous pacemaker noted from the left. Right  carotid system: Right common carotid artery widely patent. Mild atherosclerotic disease in the proximal right internal carotid artery without significant stenosis. External carotid artery widely patent Left carotid system: Left common carotid artery has mild atherosclerotic disease without stenosis. Mild atherosclerotic calcification left carotid bifurcation without significant stenosis. External carotid artery widely patent Vertebral arteries: Moderate stenosis origin of left vertebral artery due to calcific plaque. Left vertebral artery is patent to the basilar without additional stenosis. Non dominant right vertebral artery is patent to the basilar without stenosis. Skeleton: Multilevel disc and facet degeneration in the cervical spine. No acute skeletal abnormality. Other neck: Negative Upper chest: Lung apices clear. Review of the MIP images confirms the above findings CTA HEAD FINDINGS Anterior circulation: Mild atherosclerotic disease in the cavernous carotid bilaterally without significant stenosis. Anterior and middle cerebral artery widely patent without stenosis. Posterior circulation: Both vertebral artery patent to the basilar. Basilar widely patent. PICA patent on the left. Right PICA not visualized. Superior cerebellar and posterior cerebral arteries patent bilaterally. Fetal origin of the posterior cerebral artery bilaterally. Venous sinuses: Patent Anatomic variants: Negative for cerebral aneurysm. Delayed phase: Normal enhancement on delayed images. Review of the MIP images confirms the above findings IMPRESSION: No acute intracranial abnormality. Mild atherosclerotic disease in the carotid artery bilaterally without significant stenosis. Moderate stenosis origin left vertebral artery. Right vertebral artery widely patent No intracranial large vessel occlusion. Electronically Signed   By: Marlan Palau M.D.   On: 09/22/2016 16:45   Dg Chest 2 View  Result Date: 09/22/2016 CLINICAL DATA:  Pain  well. Cardiac pacemaker. Dizziness and chest pain. EXAM: CHEST  2 VIEW COMPARISON:  01/27/2015 FINDINGS: Left-sided pacemaker unchanged. Lungs are adequately inflated without consolidation or effusion. Cardiomediastinal silhouette is within normal. There are mild degenerative changes of the spine. Postsurgical change left AC joint. IMPRESSION: No acute cardiopulmonary disease. Electronically Signed   By: Elberta Fortis M.D.   On: 09/22/2016 14:44   Ct Angio Neck W And/or Wo Contrast  Result Date: 09/22/2016 CLINICAL DATA:  Headache.  Neuro deficit EXAM: CT ANGIOGRAPHY HEAD AND NECK TECHNIQUE: Multidetector CT imaging of the head and neck was performed using the standard protocol during bolus administration of intravenous contrast. Multiplanar CT image reconstructions and MIPs were obtained to evaluate the vascular anatomy. Carotid stenosis measurements (when applicable)  are obtained utilizing NASCET criteria, using the distal internal carotid diameter as the denominator. CONTRAST:  100 mL Isovue 370 IV COMPARISON:  CT head 01/15/2014 FINDINGS: CT HEAD FINDINGS Brain: Ventricle size and cerebral volume normal for age. Negative for acute infarct. Negative for hemorrhage or mass. Mild chronic white matter changes. Vascular: Negative for hyperdense vessel Skull: Negative Sinuses: Mucosal edema in the paranasal sinuses.  Negative orbit. Orbits: Negative Review of the MIP images confirms the above findings CTA NECK FINDINGS Aortic arch: Minimal atherosclerotic disease in the aortic arch. Transvenous pacemaker noted from the left. Right carotid system: Right common carotid artery widely patent. Mild atherosclerotic disease in the proximal right internal carotid artery without significant stenosis. External carotid artery widely patent Left carotid system: Left common carotid artery has mild atherosclerotic disease without stenosis. Mild atherosclerotic calcification left carotid bifurcation without significant stenosis.  External carotid artery widely patent Vertebral arteries: Moderate stenosis origin of left vertebral artery due to calcific plaque. Left vertebral artery is patent to the basilar without additional stenosis. Non dominant right vertebral artery is patent to the basilar without stenosis. Skeleton: Multilevel disc and facet degeneration in the cervical spine. No acute skeletal abnormality. Other neck: Negative Upper chest: Lung apices clear. Review of the MIP images confirms the above findings CTA HEAD FINDINGS Anterior circulation: Mild atherosclerotic disease in the cavernous carotid bilaterally without significant stenosis. Anterior and middle cerebral artery widely patent without stenosis. Posterior circulation: Both vertebral artery patent to the basilar. Basilar widely patent. PICA patent on the left. Right PICA not visualized. Superior cerebellar and posterior cerebral arteries patent bilaterally. Fetal origin of the posterior cerebral artery bilaterally. Venous sinuses: Patent Anatomic variants: Negative for cerebral aneurysm. Delayed phase: Normal enhancement on delayed images. Review of the MIP images confirms the above findings IMPRESSION: No acute intracranial abnormality. Mild atherosclerotic disease in the carotid artery bilaterally without significant stenosis. Moderate stenosis origin left vertebral artery. Right vertebral artery widely patent No intracranial large vessel occlusion. Electronically Signed   By: Marlan Palau M.D.   On: 09/22/2016 16:45    Procedures Procedures (including critical care time)  Medications Ordered in ED Medications  iopamidol (ISOVUE-370) 76 % injection 100 mL (100 mLs Intravenous Contrast Given 09/22/16 1611)     Initial Impression / Assessment and Plan / ED Course  I have reviewed the triage vital signs and the nursing notes.  Pertinent labs & imaging results that were available during my care of the patient were reviewed by me and considered in my medical  decision making (see chart for details).     Patient labs including troponin unremarkable. Patient's pacemaker was interrogated and seem to be working fine. He has a known history of vertebral artery problems and his CT angios shows some moderate left vertebral artery disease which she takes aspirin 4. Patient no longer had the discomfort in his chest. He will follow-up with family doctor and cardiologist in the next couple weeks  Final Clinical Impressions(s) / ED Diagnoses   Final diagnoses:  Chest pain at rest    New Prescriptions New Prescriptions   No medications on file     Bethann Berkshire, MD 09/22/16 1847

## 2016-09-22 NOTE — ED Triage Notes (Signed)
Chest pain and had pacemaker checked out last week  Has appt 10/2 at [facility] w doctor there  Chest pain mid sternal since yesterday

## 2016-09-22 NOTE — ED Notes (Signed)
Patient asking for something to drink.  Dr. Estell Harpin approved Patient given a Coke

## 2016-09-22 NOTE — Discharge Instructions (Signed)
Follow up with your md as planned.  Call them next week for possible earlier appointment.  Give them your test results

## 2016-10-05 ENCOUNTER — Emergency Department (HOSPITAL_COMMUNITY): Payer: Non-veteran care

## 2016-10-05 ENCOUNTER — Emergency Department (HOSPITAL_COMMUNITY)
Admission: EM | Admit: 2016-10-05 | Discharge: 2016-10-06 | Disposition: A | Discharge status: Home / Self Care | Payer: Non-veteran care | Attending: Emergency Medicine | Admitting: Emergency Medicine

## 2016-10-05 ENCOUNTER — Encounter (HOSPITAL_COMMUNITY): Payer: Self-pay | Admitting: Emergency Medicine

## 2016-10-05 DIAGNOSIS — I2510 Atherosclerotic heart disease of native coronary artery without angina pectoris: Secondary | ICD-10-CM | POA: Insufficient documentation

## 2016-10-05 DIAGNOSIS — Z79899 Other long term (current) drug therapy: Secondary | ICD-10-CM | POA: Insufficient documentation

## 2016-10-05 DIAGNOSIS — I252 Old myocardial infarction: Secondary | ICD-10-CM | POA: Diagnosis not present

## 2016-10-05 DIAGNOSIS — I10 Essential (primary) hypertension: Secondary | ICD-10-CM | POA: Diagnosis not present

## 2016-10-05 DIAGNOSIS — R079 Chest pain, unspecified: Secondary | ICD-10-CM | POA: Diagnosis present

## 2016-10-05 DIAGNOSIS — R0789 Other chest pain: Secondary | ICD-10-CM | POA: Diagnosis not present

## 2016-10-05 DIAGNOSIS — E119 Type 2 diabetes mellitus without complications: Secondary | ICD-10-CM | POA: Insufficient documentation

## 2016-10-05 DIAGNOSIS — Z7984 Long term (current) use of oral hypoglycemic drugs: Secondary | ICD-10-CM | POA: Insufficient documentation

## 2016-10-05 DIAGNOSIS — Z87891 Personal history of nicotine dependence: Secondary | ICD-10-CM | POA: Insufficient documentation

## 2016-10-05 DIAGNOSIS — Z955 Presence of coronary angioplasty implant and graft: Secondary | ICD-10-CM | POA: Diagnosis not present

## 2016-10-05 LAB — BASIC METABOLIC PANEL
ANION GAP: 7 (ref 5–15)
BUN: 18 mg/dL (ref 6–20)
CALCIUM: 8.6 mg/dL — AB (ref 8.9–10.3)
CO2: 23 mmol/L (ref 22–32)
Chloride: 104 mmol/L (ref 101–111)
Creatinine, Ser: 0.8 mg/dL (ref 0.61–1.24)
Glucose, Bld: 168 mg/dL — ABNORMAL HIGH (ref 65–99)
Potassium: 4.8 mmol/L (ref 3.5–5.1)
SODIUM: 134 mmol/L — AB (ref 135–145)

## 2016-10-05 LAB — CBC
HEMATOCRIT: 38.4 % — AB (ref 39.0–52.0)
HEMOGLOBIN: 13.1 g/dL (ref 13.0–17.0)
MCH: 31.4 pg (ref 26.0–34.0)
MCHC: 34.1 g/dL (ref 30.0–36.0)
MCV: 92.1 fL (ref 78.0–100.0)
Platelets: 123 10*3/uL — ABNORMAL LOW (ref 150–400)
RBC: 4.17 MIL/uL — ABNORMAL LOW (ref 4.22–5.81)
RDW: 13.5 % (ref 11.5–15.5)
WBC: 5.8 10*3/uL (ref 4.0–10.5)

## 2016-10-05 LAB — TROPONIN I: Troponin I: <0.03 ng/mL (ref <0.03)

## 2016-10-05 MED ORDER — NITROGLYCERIN 2 % TD OINT
1.0000 [in_us] | TOPICAL_OINTMENT | Freq: Once | TRANSDERMAL | Status: AC
  Administered 2016-10-05: 1 [in_us] via TOPICAL
  Filled 2016-10-05: qty 1

## 2016-10-05 NOTE — ED Triage Notes (Signed)
Pt c/o chest pain over the last week and was seen here for the same. Pt had stress test done at [facility] earlier today and since then chest pain is worse. Pt states he feels hot and cannot cool down.

## 2016-10-05 NOTE — ED Notes (Signed)
ED Provider at bedside. 

## 2016-10-05 NOTE — ED Notes (Signed)
ED Provider at bedside. See edp assessment for further,

## 2016-10-06 LAB — BRAIN NATRIURETIC PEPTIDE: B NATRIURETIC PEPTIDE 5: 49 pg/mL (ref 0.0–100.0)

## 2016-10-06 LAB — TROPONIN I: Troponin I: <0.03 ng/mL (ref <0.03)

## 2016-10-06 MED ORDER — NITROGLYCERIN 0.4 MG SL SUBL: 0.4000 mg | SUBLINGUAL_TABLET | SUBLINGUAL | 0 refills | Status: AC | PRN

## 2016-10-06 NOTE — ED Provider Notes (Signed)
[facility]-EMERGENCY DEPT Provider Note   CSN: [id number] Arrival date & time: 10/05/16  2037  Time seen 23:23 PM   History   Chief Complaint Chief Complaint  Patient presents with  . Chest Pain    HPI Marcus Patterson is a 71 y.o. male.  HPI patient reports he has a history coronary artery disease, he states he had a stent placed about 8 years ago. He also has a pacemaker for about 8 years. He has had his pacemaker interrogated couple times in the past month and he states he still has 3 years left on his battery. He reports chest pain for the past 2-3 weeks. He states it comes and goes and can last up to 20 minutes at a time. He states it's in the center of his chest and he states it's like a electrical shock that radiates up into his neck. He had a nuclear medicine stress test done today at [facility] and left about 9:30 AM. At the time he left he did not have any pain however since then he has had pain almost constantly all day. He states since he had the test he feels hot and he has been clammy. When he checked his blood pressure tonight it was 176/118. He called the [facility] my told him he should come be evaluated. He states he feels a little short of breath, he  denies nausea, vomiting or overt diaphoresis. When I was talking to him he states his pain is a 0 however just prior to coming to the ED his pain wasn't 8 or 9 out of 10. He states he has nitroglycerin but he has not tried any nitroglycerin over the last couple weeks for chest pain. He states his pain can occur at rest or with exertion. He has an appointment at 9:30 on October 6 with his PCP at the [facility].  PCP Kathryne Sharper [facility]  Past Medical History:  Diagnosis Date  . Agent orange exposure   . Coronary artery disease   . Diabetes mellitus   . Headache   . Hyperlipidemia   . Hypertension   . Myocardial infarction [facility]) 2009   X3 -stent placed and pacemaker (2011)  . PTSD (post-traumatic stress  disorder)   . Vertigo     Patient Active Problem List   Diagnosis Date Noted  . Dyspnea on exertion 03/03/2013  . Diabetes (HCC) 03/03/2013  . Essential hypertension, benign 03/03/2013  . Other and unspecified hyperlipidemia 03/03/2013  . CAD (coronary artery disease) 03/03/2013  . Other malaise and fatigue 03/03/2013  . DOE (dyspnea on exertion) 03/03/2013    Past Surgical History:  Procedure Laterality Date  . BACK SURGERY    . CAROTID STENT    . KNEE SURGERY    . PACEMAKER INSERTION         Home Medications    Prior to Admission medications   Medication Sig Start Date End Date Taking? Authorizing Provider  acarbose (PRECOSE) 25 MG tablet Take 50 mg by mouth 2 (two) times daily.    [provider]  albuterol (PROVENTIL HFA;VENTOLIN HFA) 108 (90 BASE) MCG/ACT inhaler Inhale 1-2 puffs into the lungs every 6 (six) hours as needed for wheezing or shortness of breath.    [provider]  aspirin 325 MG tablet Take 325 mg by mouth daily.    [provider]  aspirin 81 MG chewable tablet Chew 81 mg by mouth daily.    [provider]  Aspirin-Acetaminophen-Caffeine (  GOODY HEADACHE PO) Take 1 packet by mouth daily as needed (for headache and pain).    [provider]  atorvastatin (LIPITOR) 80 MG tablet Take 80 mg by mouth daily.     [provider]  Cyanocobalamin (VITAMIN B 12 PO) Take 1,000 mcg by mouth every evening.     [provider]  HYDROcodone-acetaminophen (NORCO/VICODIN) 5-325 MG tablet Take 1 tablet by mouth every 6 (six) hours as needed for moderate pain.    [provider]  lisinopril (PRINIVIL,ZESTRIL) 40 MG tablet Take 40 mg by mouth every morning.    [provider]  metFORMIN (GLUCOPHAGE) 500 MG tablet Take 500 mg by mouth 2 (two) times daily with a meal.    [provider]  METOPROLOL TARTRATE PO Take 0.5-1 tablets by mouth 2 (two) times daily. Patient takes 1/2 tablet in the  morning and 1 tablet in the evening    [provider]  Multiple Vitamin (MULTIVITAMIN WITH MINERALS) TABS tablet Take 1 tablet by mouth every morning.     [provider]  nitroGLYCERIN (NITROSTAT) 0.4 MG SL tablet Place 1 tablet (0.4 mg total) under the tongue every 5 (five) minutes as needed for chest pain. 10/06/16   Devoria Albe, MD  Omega-3 Fatty Acids (FISH OIL PO) Take 2,000 mg by mouth 2 (two) times daily.     [provider]  omeprazole (PRILOSEC) 20 MG capsule Take 20 mg by mouth every morning.    [provider]    Family History No family history on file.  Social History Social History  Substance Use Topics  . Smoking status: Former Smoker    Packs/day: 3.00    Years: 40.00    Types: Cigarettes    Quit date: 01/03/2007  . Smokeless tobacco: Former Neurosurgeon    Types: Chew, Snuff    Quit date: 01/03/2011  . Alcohol use No  lives with spouse   Allergies   Patient has no known allergies.   Review of Systems Review of Systems  All other systems reviewed and are negative.    Physical Exam Updated Vital Signs BP (!) 154/99   Pulse 62   Temp 97.9 F (36.6 C) (Oral)   Resp 16   SpO2 98%   Vital signs normal except hypertension   Physical Exam  Constitutional: He is oriented to person, place, and time. He appears well-developed and well-nourished.  Non-toxic appearance. He does not appear ill. No distress.  HENT:  Head: Normocephalic and atraumatic.  Right Ear: External ear normal.  Left Ear: External ear normal.  Nose: Nose normal. No mucosal edema or rhinorrhea.  Mouth/Throat: Oropharynx is clear and moist and mucous membranes are normal. No dental abscesses or uvula swelling.  Eyes: Pupils are equal, round, and reactive to light. Conjunctivae and EOM are normal.  Neck: Normal range of motion and full passive range of motion without pain. Neck supple.  Cardiovascular: Normal rate, regular rhythm and normal heart sounds.  Exam  reveals no gallop and no friction rub.   No murmur heard. Pulmonary/Chest: Effort normal and breath sounds normal. No respiratory distress. He has no wheezes. He has no rhonchi. He has no rales. He exhibits no tenderness and no crepitus.  Abdominal: Soft. Normal appearance and bowel sounds are normal. He exhibits no distension. There is no tenderness. There is no rebound and no guarding.  Musculoskeletal: Normal range of motion. He exhibits no edema or tenderness.  Moves all extremities well.   Neurological: He  is alert and oriented to person, place, and time. He has normal strength. No cranial nerve deficit.  Skin: Skin is warm, dry and intact. No rash noted. No erythema. No pallor.  Psychiatric: He has a normal mood and affect. His speech is normal and behavior is normal. His mood appears not anxious.  Nursing note and vitals reviewed.    ED Treatments / Results  Labs (all labs ordered are listed, but only abnormal results are displayed) Results for orders placed or performed during the hospital encounter of 10/05/16  Basic metabolic panel  Result Value Ref Range   Sodium 134 (L) 135 - 145 mmol/L   Potassium 4.8 3.5 - 5.1 mmol/L   Chloride 104 101 - 111 mmol/L   CO2 23 22 - 32 mmol/L   Glucose, Bld 168 (H) 65 - 99 mg/dL   BUN 18 6 - 20 mg/dL   Creatinine, Ser 1.61 0.61 - 1.24 mg/dL   Calcium 8.6 (L) 8.9 - 10.3 mg/dL   GFR calc non Af Amer >60 >60 mL/min   GFR calc Af Amer >60 >60 mL/min   Anion gap 7 5 - 15  CBC  Result Value Ref Range   WBC 5.8 4.0 - 10.5 K/uL   RBC 4.17 (L) 4.22 - 5.81 MIL/uL   Hemoglobin 13.1 13.0 - 17.0 g/dL   HCT 09.6 (L) 04.5 - 40.9 %   MCV 92.1 78.0 - 100.0 fL   MCH 31.4 26.0 - 34.0 pg   MCHC 34.1 30.0 - 36.0 g/dL   RDW 81.1 91.4 - 78.2 %   Platelets 123 (L) 150 - 400 K/uL  Troponin I  Result Value Ref Range   Troponin I <0.03 <0.03 ng/mL  Brain natriuretic peptide  Result Value Ref Range   B Natriuretic Peptide 49.0 0.0 - 100.0 pg/mL  Troponin  I  Result Value Ref Range   Troponin I <0.03 <0.03 ng/mL    Laboratory interpretation all normal except Hyperglycemia, negative delta troponin   EKG  EKG Interpretation  Date/Time:  Thursday October 05 2016 20:44:08 EDT Ventricular Rate:  66 PR Interval:  222 QRS Duration: 122 QT Interval:  394 QTC Calculation: 413 R Axis:   53 Text Interpretation:  Atrial-paced rhythm with prolonged AV conduction Right bundle branch block Abnormal ECG No significant change since last tracing Confirmed by Doug Sou [phone]) on 10/05/2016 8:47:29 PM       Radiology Dg Chest 2 View  Result Date: 10/05/2016 CLINICAL DATA:  Central chest pain x 1 week. Pt had stress test at [facility] hospital today, since he has had headache, High BP, and feels hot all over. History of DM, HTN, MI, coronary artery disease, carotid stent, pacemaker. EXAM: CHEST  2 VIEW COMPARISON:  09/22/2016 FINDINGS: Left-sided transvenous pacemaker leads to the right atrium and right ventricle. Heart size is normal. There are no focal consolidations or pleural effusions. No pulmonary edema. IMPRESSION: No evidence for acute cardiopulmonary abnormality. Electronically Signed   By: Norva Pavlov M.D.   On: 10/05/2016 21:08   Ct Angio Head W Or Wo Contrast Ct Angio Neck W And/or Wo Contrast  Result Date: 09/22/2016 CLINICAL DATA:  Headache.  Neuro deficit  IMPRESSION: No acute intracranial abnormality. Mild atherosclerotic disease in the carotid artery bilaterally without significant stenosis. Moderate stenosis origin left vertebral artery. Right vertebral artery widely patent No intracranial large vessel occlusion. Electronically Signed   By: Marlan Palau M.D.   On: 09/22/2016 16:45    Procedures Procedures (including  critical care time)  Medications Ordered in ED Medications  nitroGLYCERIN (NITROGLYN) 2 % ointment 1 inch (1 inch Topical Given 10/05/16 2347)     Initial Impression / Assessment and Plan / ED Course  I have  reviewed the triage vital signs and the nursing notes.  Pertinent labs & imaging results that were available during my care of the patient were reviewed by me and considered in my medical decision making (see chart for details).    We discussed his test results. Delta troponin was ordered. Patient was given nitroglycerin paste for his chest pain which started as I was finishing his interview and for his hypertension.  Patient was discharged home after his delta troponin came back negative. He is encouraged to keep his appointment at the [facility] later this morning.  Final Clinical Impressions(s) / ED Diagnoses   Final diagnoses:  Atypical chest pain    Plan discharge  Devoria Albe, MD, Concha Pyo, MD 10/06/16 [phone]

## 2016-10-06 NOTE — ED Notes (Signed)
Pt has been having problems with ongoing chest pain that is described as feeling "electrical"  And will radiate up to neck area for the past few weeks, had stress done performed at [place] today, unsure of results, pt states that he was having chest pain while performing the stress test,

## 2016-10-06 NOTE — Discharge Instructions (Signed)
Keep your appointments at the [facility] to get your results of your stress test. Try the nitroglycerin for your chest pain.  Return to the ED for any pain that lasts more than 30 minutes constantly.

## 2016-10-06 NOTE — ED Notes (Signed)
Nitro paste removed from pt's chest area, tolerated well

## 2017-07-21 ENCOUNTER — Other Ambulatory Visit: Payer: Self-pay

## 2017-07-21 ENCOUNTER — Encounter (HOSPITAL_COMMUNITY): Payer: Self-pay | Admitting: Emergency Medicine

## 2017-07-21 ENCOUNTER — Emergency Department (HOSPITAL_COMMUNITY)
Admission: EM | Admit: 2017-07-21 | Discharge: 2017-07-21 | Disposition: A | Discharge status: Home / Self Care | Payer: Non-veteran care | Attending: Emergency Medicine | Admitting: Emergency Medicine

## 2017-07-21 DIAGNOSIS — L509 Urticaria, unspecified: Secondary | ICD-10-CM | POA: Diagnosis not present

## 2017-07-21 DIAGNOSIS — Z79899 Other long term (current) drug therapy: Secondary | ICD-10-CM | POA: Diagnosis not present

## 2017-07-21 DIAGNOSIS — I2510 Atherosclerotic heart disease of native coronary artery without angina pectoris: Secondary | ICD-10-CM | POA: Insufficient documentation

## 2017-07-21 DIAGNOSIS — Z87891 Personal history of nicotine dependence: Secondary | ICD-10-CM | POA: Insufficient documentation

## 2017-07-21 DIAGNOSIS — I252 Old myocardial infarction: Secondary | ICD-10-CM | POA: Diagnosis not present

## 2017-07-21 DIAGNOSIS — Z950 Presence of cardiac pacemaker: Secondary | ICD-10-CM | POA: Diagnosis not present

## 2017-07-21 DIAGNOSIS — R21 Rash and other nonspecific skin eruption: Secondary | ICD-10-CM | POA: Diagnosis present

## 2017-07-21 DIAGNOSIS — Z7982 Long term (current) use of aspirin: Secondary | ICD-10-CM | POA: Insufficient documentation

## 2017-07-21 DIAGNOSIS — Z7984 Long term (current) use of oral hypoglycemic drugs: Secondary | ICD-10-CM | POA: Insufficient documentation

## 2017-07-21 DIAGNOSIS — I10 Essential (primary) hypertension: Secondary | ICD-10-CM | POA: Diagnosis not present

## 2017-07-21 DIAGNOSIS — E119 Type 2 diabetes mellitus without complications: Secondary | ICD-10-CM | POA: Diagnosis not present

## 2017-07-21 MED ORDER — FAMOTIDINE IN NACL 20-0.9 MG/50ML-% IV SOLN
20.0000 mg | Freq: Once | INTRAVENOUS | Status: AC
  Administered 2017-07-21: 20 mg via INTRAVENOUS
  Filled 2017-07-21: qty 50

## 2017-07-21 MED ORDER — PREDNISONE 10 MG (21) PO TBPK: ORAL_TABLET | ORAL | 0 refills | Status: AC

## 2017-07-21 MED ORDER — METHYLPREDNISOLONE SODIUM SUCC 125 MG IJ SOLR
125.0000 mg | Freq: Once | INTRAMUSCULAR | Status: AC
  Administered 2017-07-21: 125 mg via INTRAVENOUS
  Filled 2017-07-21: qty 2

## 2017-07-21 MED ORDER — DIPHENHYDRAMINE HCL 50 MG/ML IJ SOLN
25.0000 mg | Freq: Once | INTRAMUSCULAR | Status: AC
  Administered 2017-07-21: 25 mg via INTRAVENOUS
  Filled 2017-07-21: qty 1

## 2017-07-21 MED ORDER — DIPHENHYDRAMINE HCL 25 MG PO TABS: 25.0000 mg | ORAL_TABLET | Freq: Four times a day (QID) | ORAL | 0 refills | Status: DC

## 2017-07-21 NOTE — ED Triage Notes (Signed)
Pt C/O rash that started around 0300 this morning. Pt states the rash started on his abdomen and spread to his legs throughout the day. Pt denies swelling in the mouth or throat.

## 2017-07-21 NOTE — Discharge Instructions (Addendum)
The medications as prescribed, return to the emergency room for difficulty swallowing or breathing

## 2017-07-21 NOTE — ED Provider Notes (Signed)
[facility] EMERGENCY DEPARTMENT Provider Note   CSN: [id number] Arrival date & time: 07/21/17  2011     History   Chief Complaint Chief Complaint  Patient presents with  . Rash    HPI Marcus Patterson is a 72 y.o. male.  HPI Patient presents to the emergency room for evaluation of a rash that developed starting around 3 in the morning.  Initially the patient noticed it on his abdomen but throughout the day it spread to his legs and the rest of his body.  He tried applying some lotion but did not take any other medications.  Patient states the rash is very itchy.  He feels like his skin is swollen in certain areas.  He denies any fevers or chills.  No difficulty swallowing or breathing.  He denies any new medications, foods or any insect stings or bites.  Had anything like this in the past. Past Medical History:  Diagnosis Date  . Agent orange exposure   . Coronary artery disease   . Diabetes mellitus   . Headache   . Hyperlipidemia   . Hypertension   . Myocardial infarction [facility]) 2009   X3 -stent placed and pacemaker (2011)  . PTSD (post-traumatic stress disorder)   . Vertigo     Patient Active Problem List   Diagnosis Date Noted  . Dyspnea on exertion 03/03/2013  . Diabetes (HCC) 03/03/2013  . Essential hypertension, benign 03/03/2013  . Other and unspecified hyperlipidemia 03/03/2013  . CAD (coronary artery disease) 03/03/2013  . Other malaise and fatigue 03/03/2013  . DOE (dyspnea on exertion) 03/03/2013    Past Surgical History:  Procedure Laterality Date  . BACK SURGERY    . CAROTID STENT    . KNEE SURGERY    . PACEMAKER INSERTION          Home Medications    Prior to Admission medications   Medication Sig Start Date End Date Taking? Authorizing Provider  acarbose (PRECOSE) 25 MG tablet Take 50 mg by mouth 2 (two) times daily.    [provider]  albuterol (PROVENTIL HFA;VENTOLIN HFA) 108 (90 BASE) MCG/ACT inhaler Inhale 1-2 puffs into the  lungs every 6 (six) hours as needed for wheezing or shortness of breath.    [provider]  aspirin 325 MG tablet Take 325 mg by mouth daily.    [provider]  aspirin 81 MG chewable tablet Chew 81 mg by mouth daily.    [provider]  Aspirin-Acetaminophen-Caffeine (GOODY HEADACHE PO) Take 1 packet by mouth daily as needed (for headache and pain).    [provider]  atorvastatin (LIPITOR) 80 MG tablet Take 80 mg by mouth daily.     [provider]  Cyanocobalamin (VITAMIN B 12 PO) Take 1,000 mcg by mouth every evening.     [provider]  diphenhydrAMINE (BENADRYL) 25 MG tablet Take 1 tablet (25 mg total) by mouth every 6 (six) hours. 07/21/17   Linwood Dibbles, MD  HYDROcodone-acetaminophen (NORCO/VICODIN) 5-325 MG tablet Take 1 tablet by mouth every 6 (six) hours as needed for moderate pain.    [provider]  lisinopril (PRINIVIL,ZESTRIL) 40 MG tablet Take 40 mg by mouth every morning.    [provider]  metFORMIN (GLUCOPHAGE) 500 MG tablet Take 500 mg by mouth 2 (two) times daily with a meal.    [provider]  METOPROLOL TARTRATE PO Take 0.5-1 tablets by mouth 2 (two) times daily. Patient takes 1/2 tablet in  the morning and 1 tablet in the evening    [provider]  Multiple Vitamin (MULTIVITAMIN WITH MINERALS) TABS tablet Take 1 tablet by mouth every morning.     [provider]  nitroGLYCERIN (NITROSTAT) 0.4 MG SL tablet Place 1 tablet (0.4 mg total) under the tongue every 5 (five) minutes as needed for chest pain. 10/06/16   Devoria AlbeKnapp, Iva, MD  Omega-3 Fatty Acids (FISH OIL PO) Take 2,000 mg by mouth 2 (two) times daily.     [provider]  omeprazole (PRILOSEC) 20 MG capsule Take 20 mg by mouth every morning.    [provider]  predniSONE (STERAPRED UNI-PAK 21 TAB) 10 MG (21) TBPK tablet Take 6 tablets (60 mg total) by mouth daily for 1 day, THEN 5 tablets (50 mg total) daily  for 1 day, THEN 4 tablets (40 mg total) daily for 1 day, THEN 3 tablets (30 mg total) daily for 1 day, THEN 2 tablets (20 mg total) daily for 1 day, THEN 1 tablet (10 mg total) daily for 1 day. 07/21/17 07/27/17  Linwood DibblesKnapp, Isiac Breighner, MD    Family History No family history on file.  Social History Social History   Tobacco Use  . Smoking status: Former Smoker    Packs/day: 3.00    Years: 40.00    Pack years: 120.00    Types: Cigarettes    Last attempt to quit: 01/03/2007    Years since quitting: 10.5  . Smokeless tobacco: Former NeurosurgeonUser    Types: Chew, Snuff    Quit date: 01/03/2011  Substance Use Topics  . Alcohol use: No  . Drug use: No     Allergies   Patient has no known allergies.   Review of Systems Review of Systems  HENT: Negative for trouble swallowing.   Respiratory: Negative for shortness of breath.   All other systems reviewed and are negative.    Physical Exam Updated Vital Signs BP 116/87 (BP Location: Right Arm)   Pulse 87   Temp 97.9 F (36.6 C) (Oral)   Resp 17   Ht 1.727 m (5\' 8" )   Wt 84.8 kg (187 lb)   SpO2 95%   BMI 28.43 kg/m   Physical Exam  Constitutional: He appears well-developed and well-nourished. No distress.  HENT:  Head: Normocephalic and atraumatic.  Right Ear: External ear normal.  Left Ear: External ear normal.  Mouth/Throat: No oropharyngeal exudate.  No oral pharyngeal edema  Eyes: Conjunctivae are normal. Right eye exhibits no discharge. Left eye exhibits no discharge. No scleral icterus.  Neck: Neck supple. No tracheal deviation present.  Cardiovascular: Normal rate, regular rhythm and intact distal pulses.  Pulmonary/Chest: Effort normal and breath sounds normal. No stridor. No respiratory distress. He has no wheezes. He has no rales.  Abdominal: Soft. Bowel sounds are normal. He exhibits no distension. There is no tenderness. There is no rebound and no guarding.  Musculoskeletal: He exhibits no edema or tenderness.  Neurological: He  is alert. He has normal strength. No cranial nerve deficit (no facial droop, extraocular movements intact, no slurred speech) or sensory deficit. He exhibits normal muscle tone. He displays no seizure activity. Coordination normal.  Skin: Skin is warm and dry. Rash noted. Rash is urticarial.  Diffuse urticarial rash  Psychiatric: He has a normal mood and affect.  Nursing note and vitals reviewed.    ED Treatments / Results   Procedures Procedures (including critical care time)  Medications Ordered in ED Medications  diphenhydrAMINE (BENADRYL)  injection 25 mg (25 mg Intravenous Given 07/21/17 2120)  methylPREDNISolone sodium succinate (SOLU-MEDROL) 125 mg/2 mL injection 125 mg (125 mg Intravenous Given 07/21/17 2122)  famotidine (PEPCID) IVPB 20 mg premix (0 mg Intravenous Stopped 07/21/17 2157)     Initial Impression / Assessment and Plan / ED Course  I have reviewed the triage vital signs and the nursing notes.  Pertinent labs & imaging results that were available during my care of the patient were reviewed by me and considered in my medical decision making (see chart for details).  Clinical Course as of Jul 21 2241  Sat Jul 21, 2017  2242 Patient was monitored in the emergency room.  He had some improvement in his urticaria.  He continues to deny any difficulty with his breathing or swallowing.   [JK]    Clinical Course User Index [JK] Linwood Dibbles, MD   Patient presented to the emergency room with a rash consistent with urticaria.  Its unclear what has caused the hives.  He denies any new medications.  He denies any new foods or insect stings.  Patient improved with treatment.  He is stable for discharge.  Final Clinical Impressions(s) / ED Diagnoses   Final diagnoses:  Urticaria    ED Discharge Orders        Ordered    diphenhydrAMINE (BENADRYL) 25 MG tablet  Every 6 hours     07/21/17 2241    predniSONE (STERAPRED UNI-PAK 21 TAB) 10 MG (21) TBPK tablet     07/21/17 2241        Linwood Dibbles, MD 07/21/17 2243

## 2017-07-22 ENCOUNTER — Emergency Department (HOSPITAL_COMMUNITY)
Admission: EM | Admit: 2017-07-22 | Discharge: 2017-07-22 | Disposition: A | Discharge status: Home / Self Care | Payer: Non-veteran care | Attending: Emergency Medicine | Admitting: Emergency Medicine

## 2017-07-22 ENCOUNTER — Encounter (HOSPITAL_COMMUNITY): Payer: Self-pay | Admitting: Emergency Medicine

## 2017-07-22 ENCOUNTER — Other Ambulatory Visit: Payer: Self-pay

## 2017-07-22 DIAGNOSIS — I10 Essential (primary) hypertension: Secondary | ICD-10-CM | POA: Insufficient documentation

## 2017-07-22 DIAGNOSIS — E119 Type 2 diabetes mellitus without complications: Secondary | ICD-10-CM | POA: Insufficient documentation

## 2017-07-22 DIAGNOSIS — Z87891 Personal history of nicotine dependence: Secondary | ICD-10-CM | POA: Diagnosis not present

## 2017-07-22 DIAGNOSIS — L509 Urticaria, unspecified: Secondary | ICD-10-CM | POA: Insufficient documentation

## 2017-07-22 DIAGNOSIS — I2510 Atherosclerotic heart disease of native coronary artery without angina pectoris: Secondary | ICD-10-CM | POA: Insufficient documentation

## 2017-07-22 DIAGNOSIS — R21 Rash and other nonspecific skin eruption: Secondary | ICD-10-CM | POA: Diagnosis present

## 2017-07-22 LAB — COMPREHENSIVE METABOLIC PANEL
ALBUMIN: 3.5 g/dL (ref 3.5–5.0)
ALT: 23 U/L (ref 0–44)
AST: 22 U/L (ref 15–41)
Alkaline Phosphatase: 77 U/L (ref 38–126)
Anion gap: 6 (ref 5–15)
BUN: 22 mg/dL (ref 8–23)
CO2: 23 mmol/L (ref 22–32)
CREATININE: 0.97 mg/dL (ref 0.61–1.24)
Calcium: 9.1 mg/dL (ref 8.9–10.3)
Chloride: 106 mmol/L (ref 98–111)
GFR calc Af Amer: >60 mL/min (ref >60)
Glucose, Bld: 288 mg/dL — ABNORMAL HIGH (ref 70–99)
Potassium: 4.7 mmol/L (ref 3.5–5.1)
Sodium: 135 mmol/L (ref 135–145)
Total Bilirubin: 0.6 mg/dL (ref 0.3–1.2)
Total Protein: 6.8 g/dL (ref 6.5–8.1)

## 2017-07-22 LAB — CBC WITH DIFFERENTIAL/PLATELET
Basophils Absolute: 0 10*3/uL (ref 0.0–0.1)
Basophils Relative: 0 %
EOS ABS: 0 10*3/uL (ref 0.0–0.7)
Eosinophils Relative: 0 %
HEMATOCRIT: 41.9 % (ref 39.0–52.0)
Hemoglobin: 14.5 g/dL (ref 13.0–17.0)
Lymphocytes Relative: 6 %
Lymphs Abs: 0.9 10*3/uL (ref 0.7–4.0)
MCH: 32.6 pg (ref 26.0–34.0)
MCHC: 34.6 g/dL (ref 30.0–36.0)
MCV: 94.2 fL (ref 78.0–100.0)
Monocytes Absolute: 0.8 10*3/uL (ref 0.1–1.0)
Monocytes Relative: 5 %
NEUTROS ABS: 13.4 10*3/uL — AB (ref 1.7–7.7)
NEUTROS PCT: 89 %
Platelets: 156 10*3/uL (ref 150–400)
RBC: 4.45 MIL/uL (ref 4.22–5.81)
RDW: 13.6 % (ref 11.5–15.5)
WBC: 15.1 10*3/uL — ABNORMAL HIGH (ref 4.0–10.5)

## 2017-07-22 MED ORDER — PREDNISONE 20 MG PO TABS: 60.0000 mg | ORAL_TABLET | Freq: Every day | ORAL | 0 refills | Status: AC

## 2017-07-22 MED ORDER — HYDROXYZINE HCL 25 MG PO TABS: 25.0000 mg | ORAL_TABLET | Freq: Four times a day (QID) | ORAL | 0 refills | Status: AC | PRN

## 2017-07-22 MED ORDER — EPINEPHRINE 0.3 MG/0.3ML IJ SOAJ: 0.3000 mg | Freq: Once | INTRAMUSCULAR | 0 refills | Status: AC

## 2017-07-22 MED ORDER — HYDROXYZINE HCL 25 MG PO TABS
50.0000 mg | ORAL_TABLET | Freq: Once | ORAL | Status: AC
  Administered 2017-07-22: 50 mg via ORAL
  Filled 2017-07-22: qty 2

## 2017-07-22 NOTE — Discharge Instructions (Signed)
You were seen in the ED today with hives. This will likely resolve with time but you have have itching for several days. Use the EpiPen as needed if you develop difficulty breathing or throat tightness. Call 911 if you have to use the EpiPen. I am continuing your 60 mg Prednisone for the next 4 days. You can then begin to taper down using your current steroid medication.

## 2017-07-22 NOTE — ED Triage Notes (Signed)
Pt states he was here last night and diagnosed with shingles and is back again because he is itching and burning "all over".

## 2017-07-22 NOTE — ED Provider Notes (Signed)
Emergency Department Provider Note   I have reviewed the triage vital signs and the nursing notes.   HISTORY  Chief Complaint Rash   HPI Marcus Patterson is a 72 y.o. male with PMH of DM, HLD, HTN, CAD, and PTSD presents to the emergency department for evaluation of continued itchy rash worsening over the past 24 hours.  The patient was seen in the emergency department yesterday and discharged home with steroid and Benadryl.  He took the steroid and Benadryl today but feels like symptoms have worsened in terms of itching.  He denies any sore throat, throat tightness, shortness of breath.  He does not experience any fevers or chills.  He denies any new medications.  He cannot identify any possible triggers for this reaction.  No prior history of similar.  No radiation of symptoms or modifying factors.   Past Medical History:  Diagnosis Date  . Agent orange exposure   . Coronary artery disease   . Diabetes mellitus   . Headache   . Hyperlipidemia   . Hypertension   . Myocardial infarction [facility]) 2009   X3 -stent placed and pacemaker (2011)  . PTSD (post-traumatic stress disorder)   . Vertigo     Patient Active Problem List   Diagnosis Date Noted  . Dyspnea on exertion 03/03/2013  . Diabetes (HCC) 03/03/2013  . Essential hypertension, benign 03/03/2013  . Other and unspecified hyperlipidemia 03/03/2013  . CAD (coronary artery disease) 03/03/2013  . Other malaise and fatigue 03/03/2013  . DOE (dyspnea on exertion) 03/03/2013    Past Surgical History:  Procedure Laterality Date  . BACK SURGERY    . CAROTID STENT    . KNEE SURGERY    . PACEMAKER INSERTION      Allergies Patient has no known allergies.  No family history on file.  Social History Social History   Tobacco Use  . Smoking status: Former Smoker    Packs/day: 3.00    Years: 40.00    Pack years: 120.00    Types: Cigarettes    Last attempt to quit: 01/03/2007    Years since quitting: 10.5  .  Smokeless tobacco: Former Neurosurgeon    Types: Chew, Snuff    Quit date: 01/03/2011  Substance Use Topics  . Alcohol use: No  . Drug use: No    Review of Systems  Constitutional: No fever/chills Eyes: No visual changes. ENT: No sore throat. Cardiovascular: Denies chest pain. Respiratory: Denies shortness of breath. Gastrointestinal: No abdominal pain.  No nausea, no vomiting.  No diarrhea.  No constipation. Genitourinary: Negative for dysuria. Musculoskeletal: Negative for back pain. Skin: Positive rash  Neurological: Negative for headaches, focal weakness or numbness.  10-point ROS otherwise negative.  ____________________________________________   PHYSICAL EXAM:  VITAL SIGNS: ED Triage Vitals  Enc Vitals Group     BP 07/22/17 1912 131/88     Pulse Rate 07/22/17 1912 94     Resp 07/22/17 1912 20     Temp 07/22/17 1912 97.8 F (36.6 C)     Temp Source 07/22/17 1912 Oral     SpO2 07/22/17 1912 96 %     Weight 07/22/17 1911 187 lb (84.8 kg)     Height 07/22/17 1911 5\' 8"  (1.727 m)     Pain Score 07/22/17 1911 10   Constitutional: Alert and oriented. Well appearing and in no acute distress. Eyes: Conjunctivae are normal.  Head: Atraumatic. Nose: No congestion/rhinnorhea. Mouth/Throat: Mucous membranes are moist.  Oropharynx non-erythematous. Neck:  No stridor.   Cardiovascular: Normal rate, regular rhythm. Good peripheral circulation. Grossly normal heart sounds.   Respiratory: Normal respiratory effort.  No retractions. Lungs CTAB. Gastrointestinal: Soft and nontender. No distention.  Musculoskeletal: No lower extremity tenderness nor edema. No gross deformities of extremities. Neurologic:  Normal speech and language. No gross focal neurologic deficits are appreciated.  Skin:  Skin is warm, dry and intact. Urticaria over arms, legs, hands, and feet. No ulcerations or bullae. No petechiae.   ____________________________________________   LABS (all labs ordered are  listed, but only abnormal results are displayed)  Labs Reviewed  COMPREHENSIVE METABOLIC PANEL - Abnormal; Notable for the following components:      Result Value   Glucose, Bld 288 (*)    All other components within normal limits  CBC WITH DIFFERENTIAL/PLATELET - Abnormal; Notable for the following components:   WBC 15.1 (*)    Neutro Abs 13.4 (*)    All other components within normal limits   ____________________________________________  RADIOLOGY  None ____________________________________________   PROCEDURES  Procedure(s) performed:   Procedures  None ____________________________________________   INITIAL IMPRESSION / ASSESSMENT AND PLAN / ED COURSE  Pertinent labs & imaging results that were available during my care of the patient were reviewed by me and considered in my medical decision making (see chart for details).  Patient returns with symptomatic urticaria.  No concern for anaphylaxis.  No clear trigger for symptoms at this time.  Patient is not taking any new medications.  Low suspicion for DRESS or developing SJS. No mucosal lesions. Plan for atarax here and screening labs for LFT and Bili.   Labs reviewed. Normal LFTs and Bilirubin. Leukocytosis likely 2/2 steroid use. No concern for DRESS. Will extend steroid to a 4 additional day burst at 60 mg and then taper. Prescribed Atarax for home use and discussed OTC meds for itching as well. Also provided EpiPen Rx as a precaution but doubt progression to anaphylaxis.   At this time, I do not feel there is any life-threatening condition present. I have reviewed and discussed all results (EKG, imaging, lab, urine as appropriate), exam findings with patient. I have reviewed nursing notes and appropriate previous records.  I feel the patient is safe to be discharged home without further emergent workup. Discussed usual and customary return precautions. Patient and family (if present) verbalize understanding and are  comfortable with this plan.  Patient will follow-up with their primary care provider. If they do not have a primary care provider, information for follow-up has been provided to them. All questions have been answered.  ____________________________________________  FINAL CLINICAL IMPRESSION(S) / ED DIAGNOSES  Final diagnoses:  Urticaria     MEDICATIONS GIVEN DURING THIS VISIT:  Medications  hydrOXYzine (ATARAX/VISTARIL) tablet 50 mg (50 mg Oral Given 07/22/17 2101)     NEW OUTPATIENT MEDICATIONS STARTED DURING THIS VISIT:  Discharge Medication List as of 07/22/2017 10:39 PM    START taking these medications   Details  EPINEPHrine (EPIPEN 2-PAK) 0.3 mg/0.3 mL IJ SOAJ injection Inject 0.3 mLs (0.3 mg total) into the muscle once for 1 dose., Starting Sun 07/22/2017, Print    hydrOXYzine (ATARAX/VISTARIL) 25 MG tablet Take 1 tablet (25 mg total) by mouth every 6 (six) hours as needed for itching., Starting Sun 07/22/2017, Print    predniSONE (DELTASONE) 20 MG tablet Take 3 tablets (60 mg total) by mouth daily for 4 days., Starting Mon 07/23/2017, Until Fri 07/27/2017, Print        Note:  This document was prepared using Dragon voice recognition software and may include unintentional dictation errors.  Alona BeneJoshua Meiah Zamudio, MD Emergency Medicine    Arzella Rehmann, Arlyss RepressJoshua G, MD 07/23/17 1000

## 2017-09-25 ENCOUNTER — Emergency Department (HOSPITAL_COMMUNITY)
Admission: EM | Admit: 2017-09-25 | Discharge: 2017-09-25 | Disposition: A | Discharge status: Home / Self Care | Payer: Non-veteran care | Attending: Emergency Medicine | Admitting: Emergency Medicine

## 2017-09-25 ENCOUNTER — Other Ambulatory Visit: Payer: Self-pay

## 2017-09-25 ENCOUNTER — Emergency Department (HOSPITAL_COMMUNITY): Payer: Non-veteran care

## 2017-09-25 ENCOUNTER — Encounter (HOSPITAL_COMMUNITY): Payer: Self-pay

## 2017-09-25 DIAGNOSIS — Z7982 Long term (current) use of aspirin: Secondary | ICD-10-CM | POA: Diagnosis not present

## 2017-09-25 DIAGNOSIS — N390 Urinary tract infection, site not specified: Secondary | ICD-10-CM | POA: Diagnosis not present

## 2017-09-25 DIAGNOSIS — Z7984 Long term (current) use of oral hypoglycemic drugs: Secondary | ICD-10-CM | POA: Insufficient documentation

## 2017-09-25 DIAGNOSIS — Z87891 Personal history of nicotine dependence: Secondary | ICD-10-CM | POA: Insufficient documentation

## 2017-09-25 DIAGNOSIS — I2510 Atherosclerotic heart disease of native coronary artery without angina pectoris: Secondary | ICD-10-CM | POA: Insufficient documentation

## 2017-09-25 DIAGNOSIS — Z79899 Other long term (current) drug therapy: Secondary | ICD-10-CM | POA: Insufficient documentation

## 2017-09-25 DIAGNOSIS — I10 Essential (primary) hypertension: Secondary | ICD-10-CM | POA: Insufficient documentation

## 2017-09-25 DIAGNOSIS — R1084 Generalized abdominal pain: Secondary | ICD-10-CM | POA: Diagnosis present

## 2017-09-25 DIAGNOSIS — E119 Type 2 diabetes mellitus without complications: Secondary | ICD-10-CM | POA: Diagnosis not present

## 2017-09-25 DIAGNOSIS — K4090 Unilateral inguinal hernia, without obstruction or gangrene, not specified as recurrent: Secondary | ICD-10-CM

## 2017-09-25 LAB — COMPREHENSIVE METABOLIC PANEL
ALBUMIN: 3.3 g/dL — AB (ref 3.5–5.0)
ALK PHOS: 59 U/L (ref 38–126)
ALT: 21 U/L (ref 0–44)
ANION GAP: 9 (ref 5–15)
AST: 25 U/L (ref 15–41)
BILIRUBIN TOTAL: 2.5 mg/dL — AB (ref 0.3–1.2)
BUN: 18 mg/dL (ref 8–23)
CALCIUM: 8.4 mg/dL — AB (ref 8.9–10.3)
CO2: 20 mmol/L — ABNORMAL LOW (ref 22–32)
Chloride: 105 mmol/L (ref 98–111)
Creatinine, Ser: 0.97 mg/dL (ref 0.61–1.24)
GLUCOSE: 181 mg/dL — AB (ref 70–99)
POTASSIUM: 3.9 mmol/L (ref 3.5–5.1)
Sodium: 134 mmol/L — ABNORMAL LOW (ref 135–145)
TOTAL PROTEIN: 6.7 g/dL (ref 6.5–8.1)

## 2017-09-25 LAB — CBC
HEMATOCRIT: 40.2 % (ref 39.0–52.0)
Hemoglobin: 13.7 g/dL (ref 13.0–17.0)
MCH: 32 pg (ref 26.0–34.0)
MCHC: 34.1 g/dL (ref 30.0–36.0)
MCV: 93.9 fL (ref 78.0–100.0)
Platelets: 120 10*3/uL — ABNORMAL LOW (ref 150–400)
RBC: 4.28 MIL/uL (ref 4.22–5.81)
RDW: 13.8 % (ref 11.5–15.5)
WBC: 16.7 10*3/uL — AB (ref 4.0–10.5)

## 2017-09-25 LAB — I-STAT TROPONIN, ED: TROPONIN I, POC: 0.02 ng/mL (ref 0.00–0.08)

## 2017-09-25 LAB — CBG MONITORING, ED: Glucose-Capillary: 180 mg/dL — ABNORMAL HIGH (ref 70–99)

## 2017-09-25 LAB — LIPASE, BLOOD: LIPASE: 23 U/L (ref 11–51)

## 2017-09-25 MED ORDER — IOPAMIDOL (ISOVUE-300) INJECTION 61%
100.0000 mL | Freq: Once | INTRAVENOUS | Status: AC | PRN
  Administered 2017-09-25: 100 mL via INTRAVENOUS

## 2017-09-25 MED ORDER — CEPHALEXIN 500 MG PO CAPS: 500.0000 mg | ORAL_CAPSULE | Freq: Four times a day (QID) | ORAL | 0 refills | Status: DC

## 2017-09-25 MED ORDER — SODIUM CHLORIDE 0.9 % IV BOLUS
1000.0000 mL | Freq: Once | INTRAVENOUS | Status: AC
  Administered 2017-09-25: 1000 mL via INTRAVENOUS

## 2017-09-25 MED ORDER — SODIUM CHLORIDE 0.9 % IV SOLN
1.0000 g | Freq: Once | INTRAVENOUS | Status: AC
  Administered 2017-09-25: 1 g via INTRAVENOUS
  Filled 2017-09-25: qty 10

## 2017-09-25 MED ORDER — ONDANSETRON HCL 4 MG/2ML IJ SOLN
4.0000 mg | Freq: Once | INTRAMUSCULAR | Status: AC
  Administered 2017-09-25: 4 mg via INTRAVENOUS
  Filled 2017-09-25: qty 2

## 2017-09-25 MED ORDER — HYDROMORPHONE HCL 1 MG/ML IJ SOLN
0.5000 mg | Freq: Once | INTRAMUSCULAR | Status: AC
  Administered 2017-09-25: 0.5 mg via INTRAVENOUS
  Filled 2017-09-25: qty 1

## 2017-09-25 NOTE — Discharge Instructions (Addendum)
You were evaluated in the emergency department for abdominal pain and urinary symptoms.  You had a CAT scan and blood work that showed you have a urinary tract infection.  You also have an inguinal hernia on the right and left.  There was no signs of obstruction.  You received some antibiotics by IV in the emergency department and we are sending you home with a prescription to complete.  Please stay well-hydrated and follow-up with your doctor.  Return if any concerns.

## 2017-09-25 NOTE — ED Notes (Signed)
Pt attempted to void while in triage, Only passed one drop of urine stating it burned

## 2017-09-25 NOTE — ED Provider Notes (Signed)
Signout from Dr. Denton LankSteinl.  72 year old male with abdominal pain and urinary symptoms.  White count but normal renal function.  He is pending a urinalysis and a CT abdomen and pelvis.  Disposition per results of testing.  Clinical Course as of Sep 26 2331  Tue Sep 25, 2017  1726 I evaluated the patient and updated him on his test results.  He knew about the hernia are ready and is not having any obstructive symptoms.  He says he is feeling well and would be happy to be discharged.  Like to give him an IV dose of some antibiotics and then will send him home on some oral antibiotics.   [MB]    Clinical Course User Index [MB] Terrilee FilesButler, Michael C, MD      Terrilee FilesButler, Michael C, MD 09/25/17 [phone]

## 2017-09-25 NOTE — ED Triage Notes (Signed)
Pt reports that he woke up with abdominal pain and urinary frequency yesterday. Noted to have foul urine smell in triage. Pt reports chills, but is afebrile. States he is nauseated  And has a HA

## 2017-09-25 NOTE — ED Provider Notes (Signed)
[facility] EMERGENCY DEPARTMENT Provider Note   CSN: [id number] Arrival date & time: 09/25/17  1333     History   Chief Complaint Chief Complaint  Patient presents with  . Abdominal Pain  . Urinary Frequency    HPI Marcus Patterson is a 72 y.o. male.  Patient c/o diffuse abd pain, moderate, constant, non radiating, present for the past 1-2 days. Also notes urinary urgency, frequency and dysuria. No hematuria. Denies hx uti. No hx prostatitis. No scrotal or testicular pain. No penile discharge. Nausea. No vomiting. Denies diarrhea or constipation. States occasionally pain shoots up to chest. No exertional cp or discomfort. No unusual doe or sob. Pt denies hx gallstones. Denies prior abd surgery.   The history is provided by the patient.  Abdominal Pain   Associated symptoms include nausea, dysuria and frequency. Pertinent negatives include fever, diarrhea, vomiting, constipation and headaches.  Urinary Frequency  Associated symptoms include abdominal pain. Pertinent negatives include no headaches and no shortness of breath.    Past Medical History:  Diagnosis Date  . Agent orange exposure   . Coronary artery disease   . Diabetes mellitus   . Headache   . Hyperlipidemia   . Hypertension   . Myocardial infarction [facility]) 2009   X3 -stent placed and pacemaker (2011)  . PTSD (post-traumatic stress disorder)   . Vertigo     Patient Active Problem List   Diagnosis Date Noted  . Dyspnea on exertion 03/03/2013  . Diabetes (HCC) 03/03/2013  . Essential hypertension, benign 03/03/2013  . Other and unspecified hyperlipidemia 03/03/2013  . CAD (coronary artery disease) 03/03/2013  . Other malaise and fatigue 03/03/2013  . DOE (dyspnea on exertion) 03/03/2013    Past Surgical History:  Procedure Laterality Date  . BACK SURGERY    . CAROTID STENT    . KNEE SURGERY    . PACEMAKER INSERTION          Home Medications    Prior to Admission medications   Medication Sig  Start Date End Date Taking? Authorizing Provider  acarbose (PRECOSE) 25 MG tablet Take 50 mg by mouth 2 (two) times daily.    [provider]  albuterol (PROVENTIL HFA;VENTOLIN HFA) 108 (90 BASE) MCG/ACT inhaler Inhale 1-2 puffs into the lungs every 6 (six) hours as needed for wheezing or shortness of breath.    [provider]  aspirin 325 MG tablet Take 325 mg by mouth daily.    [provider]  aspirin 81 MG chewable tablet Chew 81 mg by mouth daily.    [provider]  Aspirin-Acetaminophen-Caffeine (GOODY HEADACHE PO) Take 1 packet by mouth daily as needed (for headache and pain).    [provider]  atorvastatin (LIPITOR) 80 MG tablet Take 80 mg by mouth daily.     [provider]  Cyanocobalamin (VITAMIN B 12 PO) Take 1,000 mcg by mouth every evening.     [provider]  diphenhydrAMINE (BENADRYL) 25 MG tablet Take 1 tablet (25 mg total) by mouth every 6 (six) hours. 07/21/17   Linwood Dibbles, MD  HYDROcodone-acetaminophen (NORCO/VICODIN) 5-325 MG tablet Take 1 tablet by mouth every 6 (six) hours as needed for moderate pain.    [provider]  hydrOXYzine (ATARAX/VISTARIL) 25 MG tablet Take 1 tablet (25 mg total) by mouth every 6 (six) hours as needed for itching. 07/22/17   Long, Arlyss Repress, MD  lisinopril (PRINIVIL,ZESTRIL) 40 MG tablet Take 40 mg by mouth every morning.  [provider]  metFORMIN (GLUCOPHAGE) 500 MG tablet Take 500 mg by mouth 2 (two) times daily with a meal.    [provider]  METOPROLOL TARTRATE PO Take 0.5-1 tablets by mouth 2 (two) times daily. Patient takes 1/2 tablet in the morning and 1 tablet in the evening    [provider]  Multiple Vitamin (MULTIVITAMIN WITH MINERALS) TABS tablet Take 1 tablet by mouth every morning.     [provider]  nitroGLYCERIN (NITROSTAT) 0.4 MG SL tablet Place 1 tablet (0.4 mg total) under the tongue every 5 (five) minutes as needed  for chest pain. 10/06/16   Devoria Albe, MD  Omega-3 Fatty Acids (FISH OIL PO) Take 2,000 mg by mouth 2 (two) times daily.     [provider]  omeprazole (PRILOSEC) 20 MG capsule Take 20 mg by mouth every morning.    [provider]  polyethylene glycol (MIRALAX / GLYCOLAX) packet Take by mouth.    [provider]    Family History No family history on file.  Social History Social History   Tobacco Use  . Smoking status: Former Smoker    Packs/day: 3.00    Years: 40.00    Pack years: 120.00    Types: Cigarettes    Last attempt to quit: 01/03/2007    Years since quitting: 10.7  . Smokeless tobacco: Former Neurosurgeon    Types: Chew, Snuff    Quit date: 01/03/2011  Substance Use Topics  . Alcohol use: No  . Drug use: No     Allergies   Patient has no known allergies.   Review of Systems Review of Systems  Constitutional: Negative for fever.  HENT: Negative for sore throat.   Eyes: Negative for redness.  Respiratory: Negative for cough and shortness of breath.   Cardiovascular: Negative for leg swelling.  Gastrointestinal: Positive for abdominal pain and nausea. Negative for constipation, diarrhea and vomiting.  Genitourinary: Positive for dysuria and frequency. Negative for flank pain.  Musculoskeletal: Negative for back pain and neck pain.  Skin: Negative for rash.  Neurological: Negative for headaches.  Hematological: Does not bruise/bleed easily.  Psychiatric/Behavioral: Negative for confusion.     Physical Exam Updated Vital Signs BP 125/72 (BP Location: Right Arm)   Pulse 94   Temp 98.5 F (36.9 C)   Resp 18   Wt 84.7 kg   SpO2 97%   BMI 28.39 kg/m   Physical Exam  Constitutional: He appears well-developed and well-nourished.  HENT:  Mouth/Throat: Oropharynx is clear and moist.  Eyes: Conjunctivae are normal. No scleral icterus.  Neck: Neck supple. No tracheal deviation present.  Cardiovascular: Normal rate, regular rhythm, normal  heart sounds and intact distal pulses. Exam reveals no gallop and no friction rub.  No murmur heard. Pulmonary/Chest: Effort normal and breath sounds normal. No accessory muscle usage. No respiratory distress. He exhibits no tenderness.  Abdominal: Soft. Bowel sounds are normal. He exhibits no distension and no mass. There is tenderness. There is no rebound and no guarding. No hernia.  Moderate mid abd tenderness. No pulsatile mass felt. No incarcerated hernia.   Genitourinary:  Genitourinary Comments: No cva tenderness. Normal external gu exam - no scrotal or testicular pain/swelling/tenderness.   Musculoskeletal: He exhibits no edema.  Neurological: He is alert.  Skin: Skin is warm and dry. No rash noted.  Psychiatric: He has a normal mood and affect.  Nursing note and vitals reviewed.    ED Treatments / Results  Labs (all  labs ordered are listed, but only abnormal results are displayed) Results for orders placed or performed during the hospital encounter of 09/25/17  Comprehensive metabolic panel  Result Value Ref Range   Sodium 134 (L) 135 - 145 mmol/L   Potassium 3.9 3.5 - 5.1 mmol/L   Chloride 105 98 - 111 mmol/L   CO2 20 (L) 22 - 32 mmol/L   Glucose, Bld 181 (H) 70 - 99 mg/dL   BUN 18 8 - 23 mg/dL   Creatinine, Ser 1.190.97 0.61 - 1.24 mg/dL   Calcium 8.4 (L) 8.9 - 10.3 mg/dL   Total Protein 6.7 6.5 - 8.1 g/dL   Albumin 3.3 (L) 3.5 - 5.0 g/dL   AST 25 15 - 41 U/L   ALT 21 0 - 44 U/L   Alkaline Phosphatase 59 38 - 126 U/L   Total Bilirubin 2.5 (H) 0.3 - 1.2 mg/dL   GFR calc non Af Amer >60 >60 mL/min   GFR calc Af Amer >60 >60 mL/min   Anion gap 9 5 - 15  CBC  Result Value Ref Range   WBC 16.7 (H) 4.0 - 10.5 K/uL   RBC 4.28 4.22 - 5.81 MIL/uL   Hemoglobin 13.7 13.0 - 17.0 g/dL   HCT 14.740.2 82.939.0 - 56.252.0 %   MCV 93.9 78.0 - 100.0 fL   MCH 32.0 26.0 - 34.0 pg   MCHC 34.1 30.0 - 36.0 g/dL   RDW 13.013.8 86.511.5 - 78.415.5 %   Platelets 120 (L) 150 - 400 K/uL  Lipase, blood  Result  Value Ref Range   Lipase 23 11 - 51 U/L  I-stat troponin, ED  Result Value Ref Range   Troponin i, poc 0.02 0.00 - 0.08 ng/mL   Comment 3          CBG monitoring, ED  Result Value Ref Range   Glucose-Capillary 180 (H) 70 - 99 mg/dL    EKG EKG Interpretation  Date/Time:  Tuesday September 25 2017 15:43:14 EDT Ventricular Rate:  60 PR Interval:    QRS Duration: 123 QT Interval:  426 QTC Calculation: 426 R Axis:   75 Text Interpretation:  Atrial-paced rhythm No significant change since last tracing Confirmed by Cathren LaineSteinl, [place] [facility] ([id number]) on 09/25/2017 3:46:51 PM   Radiology No results found.  Procedures Procedures (including critical care time)  Medications Ordered in ED Medications  sodium chloride 0.9 % bolus 1,000 mL (1,000 mLs Intravenous New Bag/Given 09/25/17 1454)  HYDROmorphone (DILAUDID) injection 0.5 mg (has no administration in time range)  ondansetron (ZOFRAN) injection 4 mg (has no administration in time range)     Initial Impression / Assessment and Plan / ED Course  I have reviewed the triage vital signs and the nursing notes.  Pertinent labs & imaging results that were available during my care of the patient were reviewed by me and considered in my medical decision making (see chart for details).  Iv ns bolus. Labs. Ecg.   Reviewed nursing notes and prior charts for additional history.   Dilaudid .5 mg iv. zofran iv.   Additional ns iv.   Initial labs reviewed - chem normal. Wbc elevated.   Persistent abd tenderness. Will get ct.   1555 ct, and ua pending - signed out to Dr Charm BargesButler to check studies, recheck pt, and dispo appropriately.     Final Clinical Impressions(s) / ED Diagnoses   Final diagnoses:  None    ED Discharge Orders    None  Cathren Laine, MD 09/25/17 [phone]

## 2018-03-27 ENCOUNTER — Emergency Department (HOSPITAL_COMMUNITY): Payer: No Typology Code available for payment source

## 2018-03-27 ENCOUNTER — Encounter (HOSPITAL_COMMUNITY): Payer: Self-pay

## 2018-03-27 ENCOUNTER — Emergency Department (HOSPITAL_COMMUNITY)
Admission: EM | Admit: 2018-03-27 | Discharge: 2018-03-27 | Disposition: A | Discharge status: Home / Self Care | Payer: No Typology Code available for payment source | Attending: Emergency Medicine | Admitting: Emergency Medicine

## 2018-03-27 ENCOUNTER — Other Ambulatory Visit: Payer: Self-pay

## 2018-03-27 DIAGNOSIS — I10 Essential (primary) hypertension: Secondary | ICD-10-CM | POA: Diagnosis not present

## 2018-03-27 DIAGNOSIS — Z7982 Long term (current) use of aspirin: Secondary | ICD-10-CM | POA: Diagnosis not present

## 2018-03-27 DIAGNOSIS — R42 Dizziness and giddiness: Secondary | ICD-10-CM | POA: Diagnosis present

## 2018-03-27 DIAGNOSIS — H81399 Other peripheral vertigo, unspecified ear: Secondary | ICD-10-CM | POA: Insufficient documentation

## 2018-03-27 DIAGNOSIS — Y939 Activity, unspecified: Secondary | ICD-10-CM | POA: Diagnosis not present

## 2018-03-27 DIAGNOSIS — Z87891 Personal history of nicotine dependence: Secondary | ICD-10-CM | POA: Diagnosis not present

## 2018-03-27 DIAGNOSIS — Z7984 Long term (current) use of oral hypoglycemic drugs: Secondary | ICD-10-CM | POA: Diagnosis not present

## 2018-03-27 DIAGNOSIS — Z79899 Other long term (current) drug therapy: Secondary | ICD-10-CM | POA: Diagnosis not present

## 2018-03-27 DIAGNOSIS — E119 Type 2 diabetes mellitus without complications: Secondary | ICD-10-CM | POA: Diagnosis not present

## 2018-03-27 DIAGNOSIS — I252 Old myocardial infarction: Secondary | ICD-10-CM | POA: Insufficient documentation

## 2018-03-27 DIAGNOSIS — I2510 Atherosclerotic heart disease of native coronary artery without angina pectoris: Secondary | ICD-10-CM | POA: Insufficient documentation

## 2018-03-27 DIAGNOSIS — Z950 Presence of cardiac pacemaker: Secondary | ICD-10-CM | POA: Insufficient documentation

## 2018-03-27 LAB — CBC
HCT: 43.5 % (ref 39.0–52.0)
Hemoglobin: 14.4 g/dL (ref 13.0–17.0)
MCH: 31.1 pg (ref 26.0–34.0)
MCHC: 33.1 g/dL (ref 30.0–36.0)
MCV: 94 fL (ref 80.0–100.0)
PLATELETS: 149 10*3/uL — AB (ref 150–400)
RBC: 4.63 MIL/uL (ref 4.22–5.81)
RDW: 13.4 % (ref 11.5–15.5)
WBC: 6.2 10*3/uL (ref 4.0–10.5)
nRBC: 0 % (ref 0.0–0.2)

## 2018-03-27 LAB — RAPID URINE DRUG SCREEN, HOSP PERFORMED
Amphetamines: NOT DETECTED
Barbiturates: NOT DETECTED
Benzodiazepines: NOT DETECTED
Cocaine: NOT DETECTED
OPIATES: NOT DETECTED
Tetrahydrocannabinol: NOT DETECTED

## 2018-03-27 LAB — URINALYSIS, ROUTINE W REFLEX MICROSCOPIC
Bilirubin Urine: NEGATIVE
Glucose, UA: 50 mg/dL — AB
Hgb urine dipstick: NEGATIVE
KETONES UR: NEGATIVE mg/dL
LEUKOCYTE UA: NEGATIVE
NITRITE: NEGATIVE
PH: 6 (ref 5.0–8.0)
Protein, ur: NEGATIVE mg/dL
SPECIFIC GRAVITY, URINE: 1.012 (ref 1.005–1.030)

## 2018-03-27 LAB — APTT: aPTT: 31 seconds (ref 24–36)

## 2018-03-27 LAB — COMPREHENSIVE METABOLIC PANEL
ALT: 31 U/L (ref 0–44)
AST: 28 U/L (ref 15–41)
Albumin: 3.7 g/dL (ref 3.5–5.0)
Alkaline Phosphatase: 91 U/L (ref 38–126)
Anion gap: 6 (ref 5–15)
BILIRUBIN TOTAL: 0.6 mg/dL (ref 0.3–1.2)
BUN: 16 mg/dL (ref 8–23)
CO2: 25 mmol/L (ref 22–32)
CREATININE: 0.87 mg/dL (ref 0.61–1.24)
Calcium: 8.6 mg/dL — ABNORMAL LOW (ref 8.9–10.3)
Chloride: 105 mmol/L (ref 98–111)
GFR calc Af Amer: >60 mL/min (ref >60)
Glucose, Bld: 221 mg/dL — ABNORMAL HIGH (ref 70–99)
Potassium: 4 mmol/L (ref 3.5–5.1)
Sodium: 136 mmol/L (ref 135–145)
TOTAL PROTEIN: 6.8 g/dL (ref 6.5–8.1)

## 2018-03-27 LAB — DIFFERENTIAL
ABS IMMATURE GRANULOCYTES: 0.01 10*3/uL (ref 0.00–0.07)
Basophils Absolute: 0 10*3/uL (ref 0.0–0.1)
Basophils Relative: 1 %
Eosinophils Absolute: 0.1 10*3/uL (ref 0.0–0.5)
Eosinophils Relative: 2 %
Immature Granulocytes: 0 %
Lymphocytes Relative: 28 %
Lymphs Abs: 1.7 10*3/uL (ref 0.7–4.0)
MONO ABS: 0.5 10*3/uL (ref 0.1–1.0)
MONOS PCT: 9 %
NEUTROS ABS: 3.8 10*3/uL (ref 1.7–7.7)
Neutrophils Relative %: 60 %

## 2018-03-27 LAB — PROTIME-INR
INR: 1.1 (ref 0.8–1.2)
Prothrombin Time: 13.9 seconds (ref 11.4–15.2)

## 2018-03-27 LAB — ETHANOL: Alcohol, Ethyl (B): <10 mg/dL (ref <10)

## 2018-03-27 LAB — TROPONIN I: Troponin I: <0.03 ng/mL (ref <0.03)

## 2018-03-27 MED ORDER — IOHEXOL 350 MG/ML SOLN
75.0000 mL | Freq: Once | INTRAVENOUS | Status: AC | PRN
  Administered 2018-03-27: 75 mL via INTRAVENOUS

## 2018-03-27 MED ORDER — MECLIZINE HCL 25 MG PO TABS: 25.0000 mg | ORAL_TABLET | Freq: Three times a day (TID) | ORAL | 0 refills | Status: AC | PRN

## 2018-03-27 MED ORDER — ONDANSETRON 4 MG PO TBDP: 4.0000 mg | ORAL_TABLET | Freq: Three times a day (TID) | ORAL | 0 refills | Status: AC | PRN

## 2018-03-27 MED ORDER — ACETAMINOPHEN 325 MG PO TABS
650.0000 mg | ORAL_TABLET | Freq: Once | ORAL | Status: AC
  Administered 2018-03-27: 650 mg via ORAL
  Filled 2018-03-27: qty 2

## 2018-03-27 MED ORDER — ONDANSETRON HCL 4 MG/2ML IJ SOLN
4.0000 mg | INTRAMUSCULAR | Status: DC | PRN
  Administered 2018-03-27: 4 mg via INTRAVENOUS
  Filled 2018-03-27: qty 2

## 2018-03-27 MED ORDER — SODIUM CHLORIDE 0.9 % IV BOLUS
500.0000 mL | Freq: Once | INTRAVENOUS | Status: AC
  Administered 2018-03-27: 500 mL via INTRAVENOUS

## 2018-03-27 MED ORDER — MECLIZINE HCL 12.5 MG PO TABS
25.0000 mg | ORAL_TABLET | Freq: Once | ORAL | Status: AC
  Administered 2018-03-27: 25 mg via ORAL
  Filled 2018-03-27: qty 2

## 2018-03-27 MED ORDER — PROMETHAZINE HCL 25 MG/ML IJ SOLN: 6.2500 mg | Freq: Once | INTRAMUSCULAR | Status: DC

## 2018-03-27 NOTE — Discharge Instructions (Addendum)
Take the prescriptions as directed.  Increase your fluids intake for the next several days. Call your regular medical doctor and Neurologist today to schedule a follow up appointment within the next 2 days.  Return to the Emergency Department immediately sooner if worsening.

## 2018-03-27 NOTE — ED Notes (Signed)
Patient transported to X-ray 

## 2018-03-27 NOTE — ED Triage Notes (Signed)
Patient states dizziness, weakness and blurred vision that started an hour ago. States weakness and dizziness started 1 week ago then resolved and returned this morning at 1045.

## 2018-03-27 NOTE — ED Notes (Signed)
Patient transported to CT 

## 2018-03-27 NOTE — ED Provider Notes (Signed)
[facility] EMERGENCY DEPARTMENT Provider Note   CSN: [id number] Arrival date & time: 03/27/18  1031    History   Chief Complaint Chief Complaint  Patient presents with   Dizziness    HPI Marcus Patterson is a 73 y.o. male.     HPI  Pt was seen at 1040. Per pt, c/o gradual onset and persistence of multiple intermittent episodes of "dizziness" for the past 7 to 8 years, worse over the past 4 days. Pt describes his dizziness as "everything is spinning." Pt states his symptoms improve with staying still, worsen with movement. Pt states he was better yesterday "laying down" and his symptoms returned today "when I started to move around." Has been associated with nausea. Denies any change in his chronic intermittent vertigo symptoms. Denies CP/palpitations, no SOB/cough, no abd pain, no vomiting/diarrhea, no fevers, no rash, no falls, no neck or back pain, no visual changes, no focal motor weakness, no tingling/numbness in extremities, no slurred speech, no facial droop.     Past Medical History:  Diagnosis Date   Agent orange exposure    Coronary artery disease    Diabetes mellitus    Headache    Hyperlipidemia    Hypertension    Myocardial infarction [facility]) 2009   X3 -stent placed and pacemaker (2011)   PTSD (post-traumatic stress disorder)    Vertigo     Patient Active Problem List   Diagnosis Date Noted   Dyspnea on exertion 03/03/2013   Diabetes (HCC) 03/03/2013   Essential hypertension, benign 03/03/2013   Other and unspecified hyperlipidemia 03/03/2013   CAD (coronary artery disease) 03/03/2013   Other malaise and fatigue 03/03/2013   DOE (dyspnea on exertion) 03/03/2013    Past Surgical History:  Procedure Laterality Date   BACK SURGERY     CAROTID STENT     KNEE SURGERY     PACEMAKER INSERTION          Home Medications    Prior to Admission medications   Medication Sig Start Date End Date Taking? Authorizing Provider  albuterol  (PROVENTIL HFA;VENTOLIN HFA) 108 (90 BASE) MCG/ACT inhaler Inhale 1-2 puffs into the lungs every 6 (six) hours as needed for wheezing or shortness of breath.    [provider]  aspirin 325 MG tablet Take 325 mg by mouth daily.    [provider]  atorvastatin (LIPITOR) 80 MG tablet Take 80 mg by mouth at bedtime.     [provider]  cephALEXin (KEFLEX) 500 MG capsule Take 1 capsule (500 mg total) by mouth 4 (four) times daily. 09/25/17   Terrilee Files, MD  cetirizine (ZYRTEC) 10 MG tablet Take 10 mg by mouth daily.    [provider]  cholecalciferol (VITAMIN D) 1000 units tablet Take 1,000 Units by mouth daily.    [provider]  Cyanocobalamin (VITAMIN B 12 PO) Take 1,000 mcg by mouth every evening.     [provider]  docusate sodium (COLACE) 100 MG capsule Take 300 mg by mouth daily.    [provider]  EPINEPHrine (EPIPEN 2-PAK) 0.3 mg/0.3 mL IJ SOAJ injection Inject 0.3 mg into the muscle once.    [provider]  hydrOXYzine (ATARAX/VISTARIL) 25 MG tablet Take 1 tablet (25 mg total) by mouth every 6 (six) hours as needed for itching. 07/22/17   Long, Arlyss Repress, MD  ibuprofen (ADVIL,MOTRIN) 600 MG tablet Take 600 mg by mouth 2 (two) times daily as needed for mild pain or  moderate pain.    [provider]  metFORMIN (GLUCOPHAGE-XR) 500 MG 24 hr tablet Take 500 mg by mouth 2 (two) times daily.    [provider]  metoprolol succinate (TOPROL-XL) 50 MG 24 hr tablet Take 25 mg by mouth at bedtime. Take with or immediately following a meal.    [provider]  nitroGLYCERIN (NITROSTAT) 0.4 MG SL tablet Place 1 tablet (0.4 mg total) under the tongue every 5 (five) minutes as needed for chest pain. 10/06/16   Devoria Albe, MD  Omega-3 Fatty Acids (FISH OIL PO) Take 2,000 mg by mouth 2 (two) times daily.     [provider]  omeprazole (PRILOSEC) 20 MG capsule Take 20 mg by mouth every morning.     [provider]  polyethylene glycol (MIRALAX / GLYCOLAX) packet Take 17 g by mouth daily.     [provider]  ranitidine (ZANTAC) 150 MG tablet Take 150 mg by mouth 2 (two) times daily.    [provider]    Family History No family history on file.  Social History Social History   Tobacco Use   Smoking status: Former Smoker    Packs/day: 3.00    Years: 40.00    Pack years: 120.00    Types: Cigarettes    Last attempt to quit: 01/03/2007    Years since quitting: 11.2   Smokeless tobacco: Former Neurosurgeon    Types: Chew, Snuff    Quit date: 01/03/2011  Substance Use Topics   Alcohol use: No   Drug use: No     Allergies   Patient has no known allergies.   Review of Systems Review of Systems ROS: Statement: All systems negative except as marked or noted in the HPI; Constitutional: Negative for fever and chills. ; ; Eyes: Negative for eye pain, redness and discharge. ; ; ENMT: Negative for ear pain, hoarseness, nasal congestion, sinus pressure and sore throat. ; ; Cardiovascular: Negative for chest pain, palpitations, diaphoresis, dyspnea and peripheral edema. ; ; Respiratory: Negative for cough, wheezing and stridor. ; ; Gastrointestinal: +nausea. Negative for vomiting, diarrhea, abdominal pain, blood in stool, hematemesis, jaundice and rectal bleeding. . ; ; Genitourinary: Negative for dysuria, flank pain and hematuria. ; ; Musculoskeletal: Negative for back pain and neck pain. Negative for swelling and trauma.; ; Skin: Negative for pruritus, rash, abrasions, blisters, bruising and skin lesion.; ; Neuro: +"dizziness." Negative for headache, lightheadedness and neck stiffness. Negative for weakness, altered level of consciousness, altered mental status, extremity weakness, paresthesias, involuntary movement, seizure and syncope.      Physical Exam Updated Vital Signs BP 140/87    Pulse 64    Temp 98.3 F (36.8 C) (Oral)    Resp 18    Ht 5\' 8"  (1.727 m)     Wt 87.1 kg    SpO2 98%    BMI 29.19 kg/m   BP 139/79    Pulse 61    Temp 98.3 F (36.8 C) (Oral)    Resp 20    Ht 5\' 8"  (1.727 m)    Wt 87.1 kg    SpO2 97%    BMI 29.19 kg/m     Physical Exam 1045: Physical examination:  Nursing notes reviewed; Vital signs and O2 SAT reviewed;  Constitutional: Well developed, Well nourished, Well hydrated, In no acute distress; Head:  Normocephalic, atraumatic; Eyes: EOMI, PERRL, No scleral icterus; ENMT: TM's clear bilat. +edemetous nasal turbinates bilat with clear rhinorrhea. Mouth and pharynx normal, Mucous  membranes moist; Neck: Supple, Full range of motion, No lymphadenopathy; Cardiovascular: Regular rate and rhythm, No gallop; Respiratory: Breath sounds clear & equal bilaterally, No wheezes.  Speaking full sentences with ease, Normal respiratory effort/excursion; Chest: Nontender, Movement normal; Abdomen: Soft, Nontender, Nondistended, Normal bowel sounds; Genitourinary: No CVA tenderness; Extremities: Peripheral pulses normal, No tenderness, No edema, No calf edema or asymmetry.; Neuro: AA&Ox3, Major CN grossly intact. Speech clear.  No facial droop.  +right horizontal end gaze fatigable nystagmus. Grips equal. Strength 5/5 equal bilat UE's and LE's.  DTR 2/4 equal bilat UE's and LE's.  No gross sensory deficits.  Normal cerebellar testing bilat UE's (finger-nose) and LE's (heel-shin)..; Skin: Color normal, Warm, Dry.   ED Treatments / Results  Labs (all labs ordered are listed, but only abnormal results are displayed)   EKG EKG Interpretation  Date/Time:  Wednesday March 27 2018 10:45:34 EDT Ventricular Rate:  65 PR Interval:    QRS Duration: 127 QT Interval:  414 QTC Calculation: 431 R Axis:   72 Text Interpretation:  Atrial-paced rhythm IVCD, consider atypical RBBB When compared with ECG of 09/25/2017 No significant change was found Confirmed by Samuel Jester [phone]) on 03/27/2018 10:51:32 AM   Radiology   Procedures Procedures  (including critical care time)  Medications Ordered in ED Medications  ondansetron (ZOFRAN) injection 4 mg (4 mg Intravenous Given 03/27/18 1132)  meclizine (ANTIVERT) tablet 25 mg (25 mg Oral Given 03/27/18 1131)  iohexol (OMNIPAQUE) 350 MG/ML injection 75 mL (75 mLs Intravenous Contrast Given 03/27/18 1227)     Initial Impression / Assessment and Plan / ED Course  I have reviewed the triage vital signs and the nursing notes.  Pertinent labs & imaging results that were available during my care of the patient were reviewed by me and considered in my medical decision making (see chart for details).     MDM Reviewed: previous chart, nursing note and vitals Reviewed previous: labs, ECG and CT scan Interpretation: labs, ECG, x-ray and CT scan   Results for orders placed or performed during the hospital encounter of 03/27/18  Ethanol  Result Value Ref Range   Alcohol, Ethyl (B) <10 <10 mg/dL  Protime-INR  Result Value Ref Range   Prothrombin Time 13.9 11.4 - 15.2 seconds   INR 1.1 0.8 - 1.2  APTT  Result Value Ref Range   aPTT 31 24 - 36 seconds  CBC  Result Value Ref Range   WBC 6.2 4.0 - 10.5 K/uL   RBC 4.63 4.22 - 5.81 MIL/uL   Hemoglobin 14.4 13.0 - 17.0 g/dL   HCT 91.4 78.2 - 95.6 %   MCV 94.0 80.0 - 100.0 fL   MCH 31.1 26.0 - 34.0 pg   MCHC 33.1 30.0 - 36.0 g/dL   RDW 21.3 08.6 - 57.8 %   Platelets 149 (L) 150 - 400 K/uL   nRBC 0.0 0.0 - 0.2 %  Differential  Result Value Ref Range   Neutrophils Relative % 60 %   Neutro Abs 3.8 1.7 - 7.7 K/uL   Lymphocytes Relative 28 %   Lymphs Abs 1.7 0.7 - 4.0 K/uL   Monocytes Relative 9 %   Monocytes Absolute 0.5 0.1 - 1.0 K/uL   Eosinophils Relative 2 %   Eosinophils Absolute 0.1 0.0 - 0.5 K/uL   Basophils Relative 1 %   Basophils Absolute 0.0 0.0 - 0.1 K/uL   Immature Granulocytes 0 %   Abs Immature Granulocytes 0.01 0.00 - 0.07 K/uL  Comprehensive metabolic  panel  Result Value Ref Range   Sodium 136 135 - 145 mmol/L    Potassium 4.0 3.5 - 5.1 mmol/L   Chloride 105 98 - 111 mmol/L   CO2 25 22 - 32 mmol/L   Glucose, Bld 221 (H) 70 - 99 mg/dL   BUN 16 8 - 23 mg/dL   Creatinine, Ser 1.66 0.61 - 1.24 mg/dL   Calcium 8.6 (L) 8.9 - 10.3 mg/dL   Total Protein 6.8 6.5 - 8.1 g/dL   Albumin 3.7 3.5 - 5.0 g/dL   AST 28 15 - 41 U/L   ALT 31 0 - 44 U/L   Alkaline Phosphatase 91 38 - 126 U/L   Total Bilirubin 0.6 0.3 - 1.2 mg/dL   GFR calc non Af Amer >60 >60 mL/min   GFR calc Af Amer >60 >60 mL/min   Anion gap 6 5 - 15  Urine rapid drug screen (hosp performed)not at [facility]  Result Value Ref Range   Opiates NONE DETECTED NONE DETECTED   Cocaine NONE DETECTED NONE DETECTED   Benzodiazepines NONE DETECTED NONE DETECTED   Amphetamines NONE DETECTED NONE DETECTED   Tetrahydrocannabinol NONE DETECTED NONE DETECTED   Barbiturates NONE DETECTED NONE DETECTED  Urinalysis, Routine w reflex microscopic  Result Value Ref Range   Color, Urine YELLOW YELLOW   APPearance CLEAR CLEAR   Specific Gravity, Urine 1.012 1.005 - 1.030   pH 6.0 5.0 - 8.0   Glucose, UA 50 (A) NEGATIVE mg/dL   Hgb urine dipstick NEGATIVE NEGATIVE   Bilirubin Urine NEGATIVE NEGATIVE   Ketones, ur NEGATIVE NEGATIVE mg/dL   Protein, ur NEGATIVE NEGATIVE mg/dL   Nitrite NEGATIVE NEGATIVE   Leukocytes,Ua NEGATIVE NEGATIVE  Troponin I - ONCE - STAT  Result Value Ref Range   Troponin I <0.03 <0.03 ng/mL   Dg Chest 2 View Result Date: 03/27/2018 CLINICAL DATA:  Weakness EXAM: CHEST - 2 VIEW COMPARISON:  10/05/2016 FINDINGS: Cardiac shadow is stable. Pacing device is again seen and stable. The lungs are well aerated bilaterally. No focal infiltrate or sizable effusion is seen. No acute bony abnormality is noted. IMPRESSION: No active cardiopulmonary disease. Electronically Signed   By: Alcide Clever M.D.   On: 03/27/2018 12:03    Ct Angio Head W Or Wo Contrast Result Date: 03/27/2018 CLINICAL DATA:  Episodic peripheral vertigo.  Blurred vision.  EXAM: CT ANGIOGRAPHY HEAD AND NECK TECHNIQUE: Multidetector CT imaging of the head and neck was performed using the standard protocol during bolus administration of intravenous contrast. Multiplanar CT image reconstructions and MIPs were obtained to evaluate the vascular anatomy. Carotid stenosis measurements (when applicable) are obtained utilizing NASCET criteria, using the distal internal carotid diameter as the denominator. CONTRAST:  120 mL OMNIPAQUE IOHEXOL 350 MG/ML SOLN COMPARISON:  CT head 09/22/2016 FINDINGS: CT HEAD FINDINGS Brain: Mild atrophy and mild chronic white matter changes bilaterally. No acute infarct, hemorrhage, or mass. No mass-effect or midline shift. Vascular: Negative for hyperdense vessel Skull: Negative Sinuses: Mucosal edema throughout the paranasal sinuses. Probable prior surgery. Orbits: Negative orbit Review of the MIP images confirms the above findings CTA NECK FINDINGS Aortic arch: Mild atherosclerotic disease aortic arch. Proximal great vessels widely patent. Right carotid system: Right common carotid artery widely patent. Mild atherosclerotic disease right carotid bifurcation without significant stenosis. Right internal carotid artery widely patent Left carotid system: Left common carotid artery widely patent. Mild atherosclerotic disease left carotid bifurcation without significant stenosis. Left internal carotid artery widely patent. Vertebral arteries: Right vertebral  artery patent the basilar without stenosis. Calcific mild stenosis origin of left vertebral artery Skeleton: Cervical disc and facet degeneration moderate to advanced in degree. No acute skeletal abnormality. Other neck: Negative for mass or adenopathy Upper chest: Lung apices clear.  Dual lead pacemaker on the left. Review of the MIP images confirms the above findings CTA HEAD FINDINGS Anterior circulation: CT of the head was inadvertently not performed during the initial scan. The patient was brought back for  repeat contrast injection and scanning of the head. Cavernous carotid widely patent bilaterally with mild atherosclerotic disease. Anterior and middle cerebral arteries patent bilaterally without significant stenosis. Posterior circulation: Both vertebral arteries patent to the basilar. Left PICA patent. Right PICA not visualized. Basilar ends in the superior cerebellar arteries bilaterally. Fetal origin of the posterior cerebral artery bilaterally. Posterior cerebral arteries are widely patent bilaterally. Venous sinuses: Normal venous enhancement. Anatomic variants: None Delayed phase: Normal enhancement postcontrast administration. Review of the MIP images confirms the above findings IMPRESSION: 1. Negative for large vessel occlusion. No significant intracranial stenosis 2. Mild atherosclerotic disease in the carotid artery bilaterally without significant stenosis. Mild stenosis origin of left vertebral artery. Electronically Signed   By: Marlan Palau M.D.   On: 03/27/2018 14:54    Ct Angio Neck W Or Wo Contrast Result Date: 03/27/2018 CLINICAL DATA:  Episodic peripheral vertigo.  Blurred vision. EXAM: CT ANGIOGRAPHY HEAD AND NECK TECHNIQUE: Multidetector CT imaging of the head and neck was performed using the standard protocol during bolus administration of intravenous contrast. Multiplanar CT image reconstructions and MIPs were obtained to evaluate the vascular anatomy. Carotid stenosis measurements (when applicable) are obtained utilizing NASCET criteria, using the distal internal carotid diameter as the denominator. CONTRAST:  120 mL OMNIPAQUE IOHEXOL 350 MG/ML SOLN COMPARISON:  CT head 09/22/2016 FINDINGS: CT HEAD FINDINGS Brain: Mild atrophy and mild chronic white matter changes bilaterally. No acute infarct, hemorrhage, or mass. No mass-effect or midline shift. Vascular: Negative for hyperdense vessel Skull: Negative Sinuses: Mucosal edema throughout the paranasal sinuses. Probable prior surgery.  Orbits: Negative orbit Review of the MIP images confirms the above findings CTA NECK FINDINGS Aortic arch: Mild atherosclerotic disease aortic arch. Proximal great vessels widely patent. Right carotid system: Right common carotid artery widely patent. Mild atherosclerotic disease right carotid bifurcation without significant stenosis. Right internal carotid artery widely patent Left carotid system: Left common carotid artery widely patent. Mild atherosclerotic disease left carotid bifurcation without significant stenosis. Left internal carotid artery widely patent. Vertebral arteries: Right vertebral artery patent the basilar without stenosis. Calcific mild stenosis origin of left vertebral artery Skeleton: Cervical disc and facet degeneration moderate to advanced in degree. No acute skeletal abnormality. Other neck: Negative for mass or adenopathy Upper chest: Lung apices clear.  Dual lead pacemaker on the left. Review of the MIP images confirms the above findings CTA HEAD FINDINGS Anterior circulation: CT of the head was inadvertently not performed during the initial scan. The patient was brought back for repeat contrast injection and scanning of the head. Cavernous carotid widely patent bilaterally with mild atherosclerotic disease. Anterior and middle cerebral arteries patent bilaterally without significant stenosis. Posterior circulation: Both vertebral arteries patent to the basilar. Left PICA patent. Right PICA not visualized. Basilar ends in the superior cerebellar arteries bilaterally. Fetal origin of the posterior cerebral artery bilaterally. Posterior cerebral arteries are widely patent bilaterally. Venous sinuses: Normal venous enhancement. Anatomic variants: None Delayed phase: Normal enhancement postcontrast administration. Review of the MIP images confirms the above findings  IMPRESSION: 1. Negative for large vessel occlusion. No significant intracranial stenosis 2. Mild atherosclerotic disease in the  carotid artery bilaterally without significant stenosis. Mild stenosis origin of left vertebral artery. Electronically Signed   By: Marlan Palau M.D.   On: 03/27/2018 14:54    1445:  Meds given, pt ambulated with steady gait. Has tol PO well without N/V. States he "feels better;" but sinuses "congested" and "hungry." Will dose APAP while awaiting CT scans results.   1500:  Pt talking on cellphone, walking around exam room. Gait steady, resps easy, NAD. CT's reassuring. Tx symptomatically, f/u Neuro MD. Dx and testing d/w pt.  Questions answered.  Verb understanding, agreeable to d/c home with outpt f/u.       Final Clinical Impressions(s) / ED Diagnoses   Final diagnoses:  None    ED Discharge Orders    None       Samuel Jester, DO 04/01/18 1729

## 2018-05-29 ENCOUNTER — Emergency Department (HOSPITAL_COMMUNITY): Payer: No Typology Code available for payment source

## 2018-05-29 ENCOUNTER — Encounter (HOSPITAL_COMMUNITY): Payer: Self-pay | Admitting: Emergency Medicine

## 2018-05-29 ENCOUNTER — Other Ambulatory Visit: Payer: Self-pay

## 2018-05-29 ENCOUNTER — Emergency Department (HOSPITAL_COMMUNITY)
Admission: EM | Admit: 2018-05-29 | Discharge: 2018-05-29 | Disposition: A | Discharge status: Home / Self Care | Payer: No Typology Code available for payment source | Attending: Emergency Medicine | Admitting: Emergency Medicine

## 2018-05-29 DIAGNOSIS — Z7984 Long term (current) use of oral hypoglycemic drugs: Secondary | ICD-10-CM | POA: Insufficient documentation

## 2018-05-29 DIAGNOSIS — R42 Dizziness and giddiness: Secondary | ICD-10-CM | POA: Diagnosis not present

## 2018-05-29 DIAGNOSIS — Z950 Presence of cardiac pacemaker: Secondary | ICD-10-CM | POA: Insufficient documentation

## 2018-05-29 DIAGNOSIS — Z7982 Long term (current) use of aspirin: Secondary | ICD-10-CM | POA: Insufficient documentation

## 2018-05-29 DIAGNOSIS — I10 Essential (primary) hypertension: Secondary | ICD-10-CM

## 2018-05-29 DIAGNOSIS — Z79899 Other long term (current) drug therapy: Secondary | ICD-10-CM | POA: Insufficient documentation

## 2018-05-29 DIAGNOSIS — I2510 Atherosclerotic heart disease of native coronary artery without angina pectoris: Secondary | ICD-10-CM | POA: Diagnosis not present

## 2018-05-29 DIAGNOSIS — I252 Old myocardial infarction: Secondary | ICD-10-CM | POA: Diagnosis not present

## 2018-05-29 DIAGNOSIS — E119 Type 2 diabetes mellitus without complications: Secondary | ICD-10-CM | POA: Diagnosis not present

## 2018-05-29 DIAGNOSIS — Z87891 Personal history of nicotine dependence: Secondary | ICD-10-CM | POA: Insufficient documentation

## 2018-05-29 MED ORDER — CLONIDINE HCL 0.1 MG PO TABS
0.1000 mg | ORAL_TABLET | Freq: Once | ORAL | Status: AC
  Administered 2018-05-29: 0.1 mg via ORAL
  Filled 2018-05-29: qty 1

## 2018-05-29 NOTE — ED Notes (Signed)
EKG performed upon patient being placed in room. Patient on 12 lead at this time

## 2018-05-29 NOTE — ED Notes (Signed)
Pt given sprite zero and sandwich per request; pt says headache and dizziness have subsided. Dr Adriana Simas made aware.

## 2018-05-29 NOTE — Discharge Instructions (Addendum)
CT scan was normal.  Decrease salt in your diet.  Rest.  Follow-up with your primary care doctor.

## 2018-05-29 NOTE — ED Triage Notes (Signed)
Pt c/o high blood pressures at home with headache and dizziness x 2 hours.

## 2018-05-29 NOTE — ED Notes (Signed)
Pt ambulatory to waiting room. Pt verbalized understanding of discharge instructions.   

## 2018-05-29 NOTE — ED Notes (Signed)
Patient transported to CT 

## 2018-05-30 NOTE — ED Provider Notes (Signed)
[facility]ENCY [facility]PARTMENT Provider Note   CSN: [id number] Arrival date & time: 05/29/18  1902    History   Chief Complaint Chief Complaint  Patient presents with  . Hypertension    HPI Marcus Patterson is a 73 y.o. male.     Sense of lightheadedness and dizziness earlier this evening.  He took his blood pressure at home and it was elevated (170/100).  No motor or sensory deficits, facial droop or asymmetry, speech difficulty, visual changes, stiff neck.  His blood pressure is usually close to normal.  Standing makes symptoms worse.     Past Medical History:  Diagnosis Date  . Agent orange exposure   . Coronary artery disease   . Diabetes mellitus   . Headache   . Hyperlipidemia   . Hypertension   . Myocardial infarction [facility](HCC) 2009   X3 -stent placed and pacemaker (2011)  . PTSD (post-traumatic stress disorder)   . Vertigo     Patient Active Problem List   Diagnosis Date Noted  . Dyspnea on exertion 03/03/2013  . Diabetes (HCC) 03/03/2013  . Essential hypertension, benign 03/03/2013  . Other and unspecified hyperlipidemia 03/03/2013  . CAD (coronary artery disease) 03/03/2013  . Other malaise and fatigue 03/03/2013  . DOE (dyspnea on exertion) 03/03/2013    Past Surgical History:  Procedure Laterality Date  . BACK SURGERY    . CAROTID STENT    . KNEE SURGERY    . PACEMAKER INSERTION          Home Medications    Prior to Admission medications   Medication Sig Start Date End Date Taking? Authorizing Provider  albuterol (PROVENTIL HFA;VENTOLIN HFA) 108 (90 BASE) MCG/ACT inhaler Inhale 1-2 puffs into the lungs every 6 (six) hours as needed for wheezing or shortness of breath.    [provider]  aspirin 325 MG tablet Take 325 mg by mouth daily.    [provider]  atorvastatin (LIPITOR) 80 MG tablet Take 80 mg by mouth at bedtime.     [provider]  cephALEXin (KEFLEX) 500 MG capsule Take 1 capsule (500 mg total) by  mouth 4 (four) times daily. 09/25/17   Terrilee FilesButler, Michael C, MD  cetirizine (ZYRTEC) 10 MG tablet Take 10 mg by mouth daily.    [provider]  cholecalciferol (VITAMIN D) 1000 units tablet Take 1,000 Units by mouth daily.    [provider]  Cyanocobalamin (VITAMIN B 12 PO) Take 1,000 mcg by mouth every evening.     [provider]  docusate sodium (COLACE) 100 MG capsule Take 300 mg by mouth daily.    [provider]  EPINEPHrine (EPIPEN 2-PAK) 0.3 mg/0.3 mL IJ SOAJ injection Inject 0.3 mg into the muscle once.    [provider]  hydrOXYzine (ATARAX/VISTARIL) 25 MG tablet Take 1 tablet (25 mg total) by mouth every 6 (six) hours as needed for itching. 07/22/17   Long, Arlyss RepressJoshua G, MD  ibuprofen (ADVIL,MOTRIN) 600 MG tablet Take 600 mg by mouth 2 (two) times daily as needed for mild pain or moderate pain.    [provider]  meclizine (ANTIVERT) 25 MG tablet Take 1 tablet (25 mg total) by mouth 3 (three) times daily as needed for dizziness. 03/27/18   Samuel JesterMcManus, Kathleen, DO  metFORMIN (GLUCOPHAGE-XR) 500 MG 24 hr tablet Take 500 mg by mouth 2 (two) times daily.    [provider]  metoprolol succinate (TOPROL-XL) 50 MG 24 hr tablet Take 25 mg  by mouth at bedtime. Take with or immediately following a meal.    [provider]  nitroGLYCERIN (NITROSTAT) 0.4 MG SL tablet Place 1 tablet (0.4 mg total) under the tongue every 5 (five) minutes as needed for chest pain. 10/06/16   Devoria Albe, MD  Omega-3 Fatty Acids (FISH OIL PO) Take 2,000 mg by mouth 2 (two) times daily.     [provider]  omeprazole (PRILOSEC) 20 MG capsule Take 20 mg by mouth every morning.    [provider]  ondansetron (ZOFRAN ODT) 4 MG disintegrating tablet Take 1 tablet (4 mg total) by mouth every 8 (eight) hours as needed for nausea or vomiting. 03/27/18   Samuel Jester, DO  polyethylene glycol (MIRALAX / GLYCOLAX) packet Take 17 g by mouth daily.      [provider]  ranitidine (ZANTAC) 150 MG tablet Take 150 mg by mouth 2 (two) times daily.    [provider]    Family History No family history on file.  Social History Social History   Tobacco Use  . Smoking status: Former Smoker    Packs/day: 3.00    Years: 40.00    Pack years: 120.00    Types: Cigarettes    Last attempt to quit: 01/03/2007    Years since quitting: 11.4  . Smokeless tobacco: Former Neurosurgeon    Types: Chew, Snuff    Quit date: 01/03/2011  Substance Use Topics  . Alcohol use: No  . Drug use: No     Allergies   Patient has no known allergies.   Review of Systems Review of Systems  All other systems reviewed and are negative.    Physical Exam Updated Vital Signs BP 128/78   Pulse (!) 59   Temp 97.8 F (36.6 C)   Resp 20   Ht  (1.727 m)   Wt 88.5 kg   SpO2 97%   BMI 29.65 kg/m   Physical Exam Vitals signs and nursing note reviewed.  Constitutional:      Appearance: He is well-developed.  HENT:     Head: Normocephalic and atraumatic.  Eyes:     Conjunctiva/sclera: Conjunctivae normal.  Neck:     Musculoskeletal: Neck supple.  Cardiovascular:     Rate and Rhythm: Normal rate and regular rhythm.  Pulmonary:     Effort: Pulmonary effort is normal.     Breath sounds: Normal breath sounds.  Abdominal:     General: Bowel sounds are normal.     Palpations: Abdomen is soft.  Musculoskeletal: Normal range of motion.  Skin:    General: Skin is warm and dry.  Neurological:     Mental Status: He is alert and oriented to person, place, and time.     Comments: Moves all 4 extremities appropriately.  Slightly ataxic on initial exam  Psychiatric:        Behavior: Behavior normal.      ED Treatments / Results  Labs (all labs ordered are listed, but only abnormal results are displayed) Labs Reviewed - No data to display  EKG EKG Interpretation  Date/Time:  Wednesday May 29 2018 19:27:35 EDT Ventricular Rate:  79  PR Interval:    QRS Duration: 120 QT Interval:  415 QTC Calculation: 476 R Axis:   77 Text Interpretation:  Atrial-paced rhythm IVCD, consider atypical RBBB No significant change since last tracing Confirmed by Richardean Canal [phone]) on 05/30/2018 1:23:30 PM   Radiology Ct Head Wo Contrast  Result Date: 05/29/2018  CLINICAL DATA:  Vertigo EXAM: CT HEAD WITHOUT CONTRAST TECHNIQUE: Contiguous axial images were obtained from the base of the skull through the vertex without intravenous contrast. COMPARISON:  Head CT 03/27/2018 FINDINGS: Brain: There is no mass, hemorrhage or extra-axial collection. The size and configuration of the ventricles and extra-axial CSF spaces are normal. The brain parenchyma is normal, without acute or chronic infarction. Vascular: No abnormal hyperdensity of the major intracranial arteries or dural venous sinuses. No intracranial atherosclerosis. Skull: The visualized skull base, calvarium and extracranial soft tissues are normal. Sinuses/Orbits: No fluid levels or advanced mucosal thickening of the visualized paranasal sinuses. No mastoid or middle ear effusion. The orbits are normal. IMPRESSION: Normal head CT. Electronically Signed   By: Deatra Robinson M.D.   On: 05/29/2018 20:31    Procedures Procedures (including critical care time)  Medications Ordered in ED Medications  cloNIDine (CATAPRES) tablet 0.1 mg (0.1 mg Oral Given 05/29/18 2039)     Initial Impression / Assessment and Plan / ED Course  I have reviewed the triage vital signs and the nursing notes.  Pertinent labs & imaging results that were available during my care of the patient were reviewed by me and considered in my medical decision making (see chart for details).        Patient presents with dizziness and lightheadedness.  Blood pressure was elevated.  Clonidine 0.1 mg administered.  CT head negative.  Patient was observed for 2 hours.  Patient rechecked prior to discharge.  Blood pressure has  improved.  He feels back to normal.  CT head negative.  Final Clinical Impressions(s) / ED Diagnoses   Final diagnoses:  Hypertension, unspecified type    ED Discharge Orders    None       Donnetta Hutching, MD 05/30/18 [phone]

## 2018-07-23 ENCOUNTER — Other Ambulatory Visit: Payer: Self-pay

## 2018-07-23 ENCOUNTER — Emergency Department (HOSPITAL_COMMUNITY)
Admission: EM | Admit: 2018-07-23 | Discharge: 2018-07-23 | Disposition: A | Discharge status: Home / Self Care | Payer: No Typology Code available for payment source | Attending: Emergency Medicine | Admitting: Emergency Medicine

## 2018-07-23 ENCOUNTER — Encounter (HOSPITAL_COMMUNITY): Payer: Self-pay

## 2018-07-23 ENCOUNTER — Emergency Department (HOSPITAL_COMMUNITY): Payer: No Typology Code available for payment source

## 2018-07-23 DIAGNOSIS — Z7982 Long term (current) use of aspirin: Secondary | ICD-10-CM | POA: Insufficient documentation

## 2018-07-23 DIAGNOSIS — I2510 Atherosclerotic heart disease of native coronary artery without angina pectoris: Secondary | ICD-10-CM | POA: Diagnosis not present

## 2018-07-23 DIAGNOSIS — E119 Type 2 diabetes mellitus without complications: Secondary | ICD-10-CM | POA: Insufficient documentation

## 2018-07-23 DIAGNOSIS — M722 Plantar fascial fibromatosis: Secondary | ICD-10-CM | POA: Insufficient documentation

## 2018-07-23 DIAGNOSIS — M79672 Pain in left foot: Secondary | ICD-10-CM | POA: Diagnosis present

## 2018-07-23 DIAGNOSIS — I10 Essential (primary) hypertension: Secondary | ICD-10-CM | POA: Insufficient documentation

## 2018-07-23 DIAGNOSIS — Z87891 Personal history of nicotine dependence: Secondary | ICD-10-CM | POA: Insufficient documentation

## 2018-07-23 DIAGNOSIS — I252 Old myocardial infarction: Secondary | ICD-10-CM | POA: Insufficient documentation

## 2018-07-23 DIAGNOSIS — Z7984 Long term (current) use of oral hypoglycemic drugs: Secondary | ICD-10-CM | POA: Diagnosis not present

## 2018-07-23 DIAGNOSIS — Z79899 Other long term (current) drug therapy: Secondary | ICD-10-CM | POA: Diagnosis not present

## 2018-07-23 DIAGNOSIS — Z950 Presence of cardiac pacemaker: Secondary | ICD-10-CM | POA: Diagnosis not present

## 2018-07-23 NOTE — ED Triage Notes (Signed)
Pt reports he was sitting yesterday and when he got up he had severe pain to left heel. Unable to bare weight due to pain. Walking on ball of foot

## 2018-07-23 NOTE — ED Provider Notes (Signed)
[facility]   CSN: [id number] Arrival date & time: 07/23/18  0846    History   Chief Complaint Chief Complaint  Patient presents with  . Foot Pain    HPI Renee PainFloyd W Dado is a 73 y.o. male.     HPI Patient presents with left foot pain.  Began acutely yesterday when he stepped down after taking a nap.  No pain for the night.  Now pain is increased.  Pain is in the bottom of the foot.  No pain higher than this.  States he previously had some pain in this foot years ago but is not something chronic for him.  States it hurts more if he walks on the ball of his foot as opposed to the heel.  States it is less painful if he wears his special diabetic shoes. Past Medical History:  Diagnosis Date  . Agent orange exposure   . Coronary artery disease   . Diabetes mellitus   . Headache   . Hyperlipidemia   . Hypertension   . Myocardial infarction [facility](HCC) 2009   X3 -stent placed and pacemaker (2011)  . PTSD (post-traumatic stress disorder)   . Vertigo     Patient Active Problem List   Diagnosis Date Noted  . Dyspnea on exertion 03/03/2013  . Diabetes (HCC) 03/03/2013  . Essential hypertension, benign 03/03/2013  . Other and unspecified hyperlipidemia 03/03/2013  . CAD (coronary artery disease) 03/03/2013  . Other malaise and fatigue 03/03/2013  . DOE (dyspnea on exertion) 03/03/2013    Past Surgical History:  Procedure Laterality Date  . BACK SURGERY    . CAROTID STENT    . KNEE SURGERY    . PACEMAKER INSERTION          Home Medications    Prior to Admission medications   Medication Sig Start Date End Date Taking? Authorizing Provider  albuterol (PROVENTIL HFA;VENTOLIN HFA) 108 (90 BASE) MCG/ACT inhaler Inhale 1-2 puffs into the lungs every 6 (six) hours as needed for wheezing or shortness of breath.    [provider]  aspirin 325 MG tablet Take 325 mg by mouth daily.    [provider]  atorvastatin (LIPITOR) 80  MG tablet Take 80 mg by mouth at bedtime.     [provider]  cephALEXin (KEFLEX) 500 MG capsule Take 1 capsule (500 mg total) by mouth 4 (four) times daily. 09/25/17   Terrilee FilesButler, Michael C, MD  cetirizine (ZYRTEC) 10 MG tablet Take 10 mg by mouth daily.    [provider]  cholecalciferol (VITAMIN D) 1000 units tablet Take 1,000 Units by mouth daily.    [provider]  Cyanocobalamin (VITAMIN B 12 PO) Take 1,000 mcg by mouth every evening.     [provider]  docusate sodium (COLACE) 100 MG capsule Take 300 mg by mouth daily.    [provider]  EPINEPHrine (EPIPEN 2-PAK) 0.3 mg/0.3 mL IJ SOAJ injection Inject 0.3 mg into the muscle once.    [provider]  hydrOXYzine (ATARAX/VISTARIL) 25 MG tablet Take 1 tablet (25 mg total) by mouth every 6 (six) hours as needed for itching. 07/22/17   Long, Arlyss RepressJoshua G, MD  ibuprofen (ADVIL,MOTRIN) 600 MG tablet Take 600 mg by mouth 2 (two) times daily as needed for mild pain or moderate pain.    [provider]  meclizine (ANTIVERT) 25 MG tablet Take 1 tablet (25 mg total) by mouth 3 (three) times daily as needed for  dizziness. 03/27/18   Francine Graven, DO  metFORMIN (GLUCOPHAGE-XR) 500 MG 24 hr tablet Take 500 mg by mouth 2 (two) times daily.    [provider]  metoprolol succinate (TOPROL-XL) 50 MG 24 hr tablet Take 25 mg by mouth at bedtime. Take with or immediately following a meal.    [provider]  nitroGLYCERIN (NITROSTAT) 0.4 MG SL tablet Place 1 tablet (0.4 mg total) under the tongue every 5 (five) minutes as needed for chest pain. 10/06/16   Rolland Porter, MD  Omega-3 Fatty Acids (FISH OIL PO) Take 2,000 mg by mouth 2 (two) times daily.     [provider]  omeprazole (PRILOSEC) 20 MG capsule Take 20 mg by mouth every morning.    [provider]  ondansetron (ZOFRAN ODT) 4 MG disintegrating tablet Take 1 tablet (4 mg total) by mouth every 8 (eight) hours as  needed for nausea or vomiting. 03/27/18   Francine Graven, DO  polyethylene glycol (MIRALAX / GLYCOLAX) packet Take 17 g by mouth daily.     [provider]  ranitidine (ZANTAC) 150 MG tablet Take 150 mg by mouth 2 (two) times daily.    [provider]    Family History No family history on file.  Social History Social History   Tobacco Use  . Smoking status: Former Smoker    Packs/day: 3.00    Years: 40.00    Pack years: 120.00    Types: Cigarettes    Quit date: 01/03/2007    Years since quitting: 11.5  . Smokeless tobacco: Former Systems developer    Types: Chew, Snuff    Quit date: 01/03/2011  Substance Use Topics  . Alcohol use: No  . Drug use: No     Allergies   Patient has no known allergies.   Review of Systems Review of Systems  Constitutional: Negative for appetite change.  Respiratory: Negative for shortness of breath.   Cardiovascular: Negative for leg swelling.  Musculoskeletal:       Left foot pain.  Neurological: Negative for weakness.     Physical Exam Updated Vital Signs BP (!) 143/99 (BP Location: Left Arm)   Pulse 91   Temp 97.9 F (36.6 C) (Oral)   Resp 16   Ht 5\' 8"  (1.727 m)   Wt 86.2 kg   SpO2 98%   BMI 28.89 kg/m   Physical Exam Vitals signs and nursing note reviewed.  Musculoskeletal:     Comments: Tenderness over mid plantar aspect of the foot on the left.  No tenderness at toes or heel.  Good range of motion the ankle.  Sensation intact.  Skin:    General: Skin is warm.     Capillary Refill: Capillary refill takes less than 2 seconds.     Findings: No erythema.  Neurological:     Mental Status: He is alert.      ED Treatments / Results  Labs (all labs ordered are listed, but only abnormal results are displayed) Labs Reviewed - No data to display  EKG None  Radiology Dg Foot Complete Left  Result Date: 07/23/2018 CLINICAL DATA:  Severe left heel pain radiating into the metatarsals since this morning. No known  injury. EXAM: LEFT FOOT - COMPLETE 3+ VIEW COMPARISON:  None. FINDINGS: Mild inferior calcaneal spur formation. Mild 1st MTP joint degenerative changes. Diffuse arterial calcifications. Otherwise, unremarkable bones and soft tissues. No fracture or dislocation seen. IMPRESSION: No acute abnormality. Mild 1st MTP joint degenerative changes and small inferior  calcaneal spur. Electronically Signed   By: Beckie SaltsSteven  Reid M.D.   On: 07/23/2018 10:08    Procedures Procedures (including critical care time)  Medications Ordered in ED Medications - No data to display   Initial Impression / Assessment and Plan / ED Course  I have reviewed the triage vital signs and the nursing notes.  Pertinent labs & imaging results that were available during my care of the patient were reviewed by me and considered in my medical decision making (see chart for details).        Patient with acute pain in left foot after stepping down.  I think either plantar fasciitis versus like a plantar fascial tear.  Hard soled shoe given.  [facility]  Patient already has pain medicines at home. Final Clinical Impressions(s) / ED Diagnoses   Final diagnoses:  Plantar fasciitis    ED Discharge Orders    None       Benjiman CorePickering, Evamae Rowen, MD 07/23/18 1146

## 2018-10-27 ENCOUNTER — Emergency Department (HOSPITAL_COMMUNITY)
Admission: EM | Admit: 2018-10-27 | Discharge: 2018-10-27 | Disposition: A | Discharge status: Home / Self Care | Payer: No Typology Code available for payment source | Attending: Emergency Medicine | Admitting: Emergency Medicine

## 2018-10-27 ENCOUNTER — Emergency Department (HOSPITAL_COMMUNITY): Payer: No Typology Code available for payment source

## 2018-10-27 ENCOUNTER — Encounter (HOSPITAL_COMMUNITY): Payer: Self-pay | Admitting: Emergency Medicine

## 2018-10-27 ENCOUNTER — Other Ambulatory Visit: Payer: Self-pay

## 2018-10-27 DIAGNOSIS — T383X5A Adverse effect of insulin and oral hypoglycemic [antidiabetic] drugs, initial encounter: Secondary | ICD-10-CM | POA: Diagnosis not present

## 2018-10-27 DIAGNOSIS — I2510 Atherosclerotic heart disease of native coronary artery without angina pectoris: Secondary | ICD-10-CM | POA: Insufficient documentation

## 2018-10-27 DIAGNOSIS — R42 Dizziness and giddiness: Secondary | ICD-10-CM

## 2018-10-27 DIAGNOSIS — Z7982 Long term (current) use of aspirin: Secondary | ICD-10-CM | POA: Diagnosis not present

## 2018-10-27 DIAGNOSIS — Z79899 Other long term (current) drug therapy: Secondary | ICD-10-CM | POA: Insufficient documentation

## 2018-10-27 DIAGNOSIS — I10 Essential (primary) hypertension: Secondary | ICD-10-CM | POA: Diagnosis not present

## 2018-10-27 DIAGNOSIS — Z87891 Personal history of nicotine dependence: Secondary | ICD-10-CM | POA: Insufficient documentation

## 2018-10-27 DIAGNOSIS — E119 Type 2 diabetes mellitus without complications: Secondary | ICD-10-CM | POA: Diagnosis not present

## 2018-10-27 DIAGNOSIS — Z7984 Long term (current) use of oral hypoglycemic drugs: Secondary | ICD-10-CM | POA: Diagnosis not present

## 2018-10-27 DIAGNOSIS — T50905A Adverse effect of unspecified drugs, medicaments and biological substances, initial encounter: Secondary | ICD-10-CM

## 2018-10-27 LAB — URINALYSIS, ROUTINE W REFLEX MICROSCOPIC
Bilirubin Urine: NEGATIVE
Glucose, UA: NEGATIVE mg/dL
Hgb urine dipstick: NEGATIVE
Ketones, ur: NEGATIVE mg/dL
Leukocytes,Ua: NEGATIVE
Nitrite: NEGATIVE
Protein, ur: NEGATIVE mg/dL
Specific Gravity, Urine: 1.019 (ref 1.005–1.030)
pH: 6 (ref 5.0–8.0)

## 2018-10-27 LAB — CBC WITH DIFFERENTIAL/PLATELET
Abs Immature Granulocytes: 0.01 10*3/uL (ref 0.00–0.07)
Basophils Absolute: 0 10*3/uL (ref 0.0–0.1)
Basophils Relative: 1 %
Eosinophils Absolute: 0.1 10*3/uL (ref 0.0–0.5)
Eosinophils Relative: 2 %
HCT: 43.5 % (ref 39.0–52.0)
Hemoglobin: 14.1 g/dL (ref 13.0–17.0)
Immature Granulocytes: 0 %
Lymphocytes Relative: 25 %
Lymphs Abs: 1.4 10*3/uL (ref 0.7–4.0)
MCH: 31.5 pg (ref 26.0–34.0)
MCHC: 32.4 g/dL (ref 30.0–36.0)
MCV: 97.1 fL (ref 80.0–100.0)
Monocytes Absolute: 0.6 10*3/uL (ref 0.1–1.0)
Monocytes Relative: 10 %
Neutro Abs: 3.5 10*3/uL (ref 1.7–7.7)
Neutrophils Relative %: 62 %
Platelets: 143 10*3/uL — ABNORMAL LOW (ref 150–400)
RBC: 4.48 MIL/uL (ref 4.22–5.81)
RDW: 13.2 % (ref 11.5–15.5)
WBC: 5.7 10*3/uL (ref 4.0–10.5)
nRBC: 0 % (ref 0.0–0.2)

## 2018-10-27 LAB — COMPREHENSIVE METABOLIC PANEL
ALT: 27 U/L (ref 0–44)
AST: 27 U/L (ref 15–41)
Albumin: 3.8 g/dL (ref 3.5–5.0)
Alkaline Phosphatase: 67 U/L (ref 38–126)
Anion gap: 8 (ref 5–15)
BUN: 21 mg/dL (ref 8–23)
CO2: 23 mmol/L (ref 22–32)
Calcium: 8.8 mg/dL — ABNORMAL LOW (ref 8.9–10.3)
Chloride: 108 mmol/L (ref 98–111)
Creatinine, Ser: 0.76 mg/dL (ref 0.61–1.24)
GFR calc Af Amer: >60 mL/min (ref >60)
GFR calc non Af Amer: >60 mL/min (ref >60)
Glucose, Bld: 146 mg/dL — ABNORMAL HIGH (ref 70–99)
Potassium: 4.4 mmol/L (ref 3.5–5.1)
Sodium: 139 mmol/L (ref 135–145)
Total Bilirubin: 1.1 mg/dL (ref 0.3–1.2)
Total Protein: 7.1 g/dL (ref 6.5–8.1)

## 2018-10-27 LAB — MAGNESIUM: Magnesium: 1.9 mg/dL (ref 1.7–2.4)

## 2018-10-27 LAB — TROPONIN I (HIGH SENSITIVITY): Troponin I (High Sensitivity): 3 ng/L (ref <18)

## 2018-10-27 LAB — CBG MONITORING, ED: Glucose-Capillary: 165 mg/dL — ABNORMAL HIGH (ref 70–99)

## 2018-10-27 MED ORDER — SODIUM CHLORIDE 0.9 % IV SOLN
INTRAVENOUS | Status: DC
  Administered 2018-10-27: 10:00:00 via INTRAVENOUS

## 2018-10-27 NOTE — ED Provider Notes (Signed)
[facility] EMERGENCY DEPARTMENT Provider Note   CSN: [id number] Arrival date & time: 10/27/18  0865     History   Chief Complaint Chief Complaint  Patient presents with  . Dizziness    HPI Marcus Patterson is a 73 y.o. male.     HPI  This patient is a very pleasant 73 year old male with a known history of coronary disease status post stenting, pacemaker, also has a history of diabetes for which he has been treated with Metformin as well as recently being started on alogliptin 25 mg a day.  He reports that he started that medication approximately 10 days ago and within the next 3 days the patient started to have some abnormal sensations including feeling generally weak, having no appetite, now feeling dizzy especially when he stands up like he is going to have a syncopal episode.  He denies vomiting or diarrhea, denies rashes, denies coughing or fevers, denies sore throat, runny nose or changes in vision or speech.  He feels diffusely fatigued but denies any focality to the symptoms.  This has been persistent for the last week, gradually worsening, he called his doctor at the [facility] this morning who recommended that he come immediately to the emergency department to be evaluated given his prior cardiac history.  Past Medical History:  Diagnosis Date  . Agent orange exposure   . Coronary artery disease   . Diabetes mellitus   . Headache   . Hyperlipidemia   . Hypertension   . Myocardial infarction [facility]) 2009   X3 -stent placed and pacemaker (2011)  . PTSD (post-traumatic stress disorder)   . Vertigo     Patient Active Problem List   Diagnosis Date Noted  . Dyspnea on exertion 03/03/2013  . Diabetes (Marcus Patterson) 03/03/2013  . Essential hypertension, benign 03/03/2013  . Other and unspecified hyperlipidemia 03/03/2013  . CAD (coronary artery disease) 03/03/2013  . Other malaise and fatigue 03/03/2013  . DOE (dyspnea on exertion) 03/03/2013    Past Surgical History:   Procedure Laterality Date  . BACK SURGERY    . CAROTID STENT    . KNEE SURGERY    . PACEMAKER INSERTION          Home Medications    Prior to Admission medications   Medication Sig Start Date End Date Taking? Authorizing Provider  albuterol (PROVENTIL HFA;VENTOLIN HFA) 108 (90 BASE) MCG/ACT inhaler Inhale 1-2 puffs into the lungs every 6 (six) hours as needed for wheezing or shortness of breath.    [provider]  aspirin 325 MG tablet Take 325 mg by mouth daily.    [provider]  atorvastatin (LIPITOR) 80 MG tablet Take 80 mg by mouth at bedtime.     [provider]  cetirizine (ZYRTEC) 10 MG tablet Take 10 mg by mouth daily.    [provider]  cholecalciferol (VITAMIN D) 1000 units tablet Take 1,000 Units by mouth daily.    [provider]  Cyanocobalamin (VITAMIN B 12 PO) Take 1,000 mcg by mouth every evening.     [provider]  docusate sodium (COLACE) 100 MG capsule Take 300 mg by mouth daily.    [provider]  EPINEPHrine (EPIPEN 2-PAK) 0.3 mg/0.3 mL IJ SOAJ injection Inject 0.3 mg into the muscle once.    [provider]  hydrOXYzine (ATARAX/VISTARIL) 25 MG tablet Take 1 tablet (25 mg total) by mouth every 6 (six) hours as needed for itching. 07/22/17   Long, Wonda Olds,  MD  ibuprofen (ADVIL,MOTRIN) 600 MG tablet Take 600 mg by mouth 2 (two) times daily as needed for mild pain or moderate pain.    [provider]  meclizine (ANTIVERT) 25 MG tablet Take 1 tablet (25 mg total) by mouth 3 (three) times daily as needed for dizziness. 03/27/18   Samuel Jester, DO  metFORMIN (GLUCOPHAGE-XR) 500 MG 24 hr tablet Take 500 mg by mouth 2 (two) times daily.    [provider]  metoprolol succinate (TOPROL-XL) 50 MG 24 hr tablet Take 25 mg by mouth at bedtime. Take with or immediately following a meal.    [provider]  nitroGLYCERIN (NITROSTAT) 0.4 MG SL tablet Place 1 tablet (0.4 mg  total) under the tongue every 5 (five) minutes as needed for chest pain. 10/06/16   Marcus Albe, MD  Omega-3 Fatty Acids (FISH OIL PO) Take 2,000 mg by mouth 2 (two) times daily.     [provider]  omeprazole (PRILOSEC) 20 MG capsule Take 20 mg by mouth every morning.    [provider]  ondansetron (ZOFRAN ODT) 4 MG disintegrating tablet Take 1 tablet (4 mg total) by mouth every 8 (eight) hours as needed for nausea or vomiting. 03/27/18   Samuel Jester, DO  polyethylene glycol (MIRALAX / GLYCOLAX) packet Take 17 g by mouth daily.     [provider]  ranitidine (ZANTAC) 150 MG tablet Take 150 mg by mouth 2 (two) times daily.    [provider]    Family History No family history on file.  Social History Social History   Tobacco Use  . Smoking status: Former Smoker    Packs/day: 3.00    Years: 40.00    Pack years: 120.00    Types: Cigarettes    Quit date: 01/03/2007    Years since quitting: 11.8  . Smokeless tobacco: Former Neurosurgeon    Types: Chew, Snuff    Quit date: 01/03/2011  Substance Use Topics  . Alcohol use: No  . Drug use: No     Allergies   Patient has no known allergies.   Review of Systems Review of Systems  All other systems reviewed and are negative.    Physical Exam Updated Vital Signs BP 134/83   Pulse 66   Temp 97.6 F (36.4 C) (Oral)   Resp 20   Ht 1.74 m (5' 8.5")   Wt 86.2 kg   SpO2 97%   BMI 28.47 kg/m   Physical Exam Vitals signs and nursing note reviewed.  Constitutional:      General: He is not in acute distress.    Appearance: He is well-developed.  HENT:     Head: Normocephalic and atraumatic.     Mouth/Throat:     Pharynx: No oropharyngeal exudate.  Eyes:     General: No scleral icterus.       Right eye: No discharge.        Left eye: No discharge.     Conjunctiva/sclera: Conjunctivae normal.     Pupils: Pupils are equal, round, and reactive to light.  Neck:     Musculoskeletal: Normal  range of motion and neck supple.     Thyroid: No thyromegaly.     Vascular: No JVD.  Cardiovascular:     Rate and Rhythm: Normal rate and regular rhythm.     Heart sounds: Normal heart sounds. No murmur. No friction rub. No gallop.   Pulmonary:     Effort: Pulmonary effort is normal. No  respiratory distress.     Breath sounds: Normal breath sounds. No wheezing or rales.  Abdominal:     General: Bowel sounds are normal. There is no distension.     Palpations: Abdomen is soft. There is no mass.     Tenderness: There is no abdominal tenderness.  Musculoskeletal: Normal range of motion.        General: No tenderness.  Lymphadenopathy:     Cervical: No cervical adenopathy.  Skin:    General: Skin is warm and dry.     Findings: No erythema or rash.  Neurological:     Mental Status: He is alert.     Coordination: Coordination normal.     Comments: The patient's neurologic exam is unremarkable with normal strength in all 4 extremities, normal finger-nose-finger, no drift, no speech problems, no facial droop, cranial nerves III through XII are normal, memory is normal, sensation is diffusely normal  Psychiatric:        Behavior: Behavior normal.      ED Treatments / Results  Labs (all labs ordered are listed, but only abnormal results are displayed) Labs Reviewed  CBC WITH DIFFERENTIAL/PLATELET - Abnormal; Notable for the following components:      Result Value   Platelets 143 (*)    All other components within normal limits  COMPREHENSIVE METABOLIC PANEL - Abnormal; Notable for the following components:   Glucose, Bld 146 (*)    Calcium 8.8 (*)    All other components within normal limits  CBG MONITORING, ED - Abnormal; Notable for the following components:   Glucose-Capillary 165 (*)    All other components within normal limits  URINALYSIS, ROUTINE W REFLEX MICROSCOPIC  MAGNESIUM  TROPONIN I (HIGH SENSITIVITY)    EKG EKG Interpretation  Date/Time:  Sunday October 27 2018  09:33:36 EDT Ventricular Rate:  71 PR Interval:    QRS Duration: 120 QT Interval:  404 QTC Calculation: 439 R Axis:   68 Text Interpretation:  Sinus rhythm IVCD, consider atypical RBBB Since last tracing pacer spikes not seen on today's EKG, no other changes Confirmed by Eber Hong ([id number]) on 10/27/2018 9:52:03 AM   Radiology Dg Chest Port 1 View  Result Date: 10/27/2018 CLINICAL DATA:  Shortness of breath and dizziness EXAM: PORTABLE CHEST 1 VIEW COMPARISON:  March 27, 2018 FINDINGS: Pacemaker leads are attached to the right atrium and right ventricle. Heart size and pulmonary vascularity are normal. No edema or consolidation. No adenopathy. No bone lesions. IMPRESSION: No edema or consolidation. Stable cardiac silhouette. Stable pacemaker lead positioning. Electronically Signed   By: Bretta Bang III M.D.   On: 10/27/2018 10:26    Procedures Procedures (including critical care time)  Medications Ordered in ED Medications  0.9 %  sodium chloride infusion ( Intravenous New Bag/Given 10/27/18 1004)     Initial Impression / Assessment and Plan / ED Course  I have reviewed the triage vital signs and the nursing notes.  Pertinent labs & imaging results that were available during my care of the patient were reviewed by me and considered in my medical decision making (see chart for details).  Clinical Course as of Oct 27 1146  Sun Oct 27, 2018  1053 The patient's chest x-ray reveals no signs of infiltrate or pneumothorax, the pacemaker leads seem appropriate, the initial laboratory work-up shows no acute findings without any signs of anemia, leukocytosis or urinary infection.  Vital signs are unremarkable, metabolic panel urinalysis troponin and magnesium are pending.  I agree with  the radiology interpretation on the x-ray that there is no acute findings   [BM]    Clinical Course User Index [BM] Eber HongMiller, Temima Kutsch, MD       I suspect the patient is having a side effect of the  medication, he does not have any specific abnormal findings on his exam however with his decreased appetite including not eating any dinner last night I suspect that his near syncope which seems to be somewhat orthostatic is likely related to the medications.  Will check orthostatics give IV fluids and check labs, the patient is agreeable.  His EKG is nonischemic  Urinalysis unremarkable, orthostatic vital signs show that there is some change with a trend towards lowering blood pressure and elevated heart rate with standing.  The patient is otherwise unremarkable and stable for discharge, no signs of cardiac dysfunction, renal dysfunction or other identifiable abnormalities.  The patient is agreeable with the plan stable for discharge  Final Clinical Impressions(s) / ED Diagnoses   Final diagnoses:  Orthostatic dizziness  Medication side effect, initial encounter    ED Discharge Orders    None       Eber HongMiller, Quinteria Chisum, MD 10/27/18 1148

## 2018-10-27 NOTE — ED Triage Notes (Addendum)
Pt states he went to a car show yesterday. When he got home he felt "sick." Stated he feels dizzy, lightheaded, SOB, fatigued, and nauseated. Pt states he thinks it may be because he started Alogliptin. Reports symptoms began two days after taking it.

## 2018-10-27 NOTE — Discharge Instructions (Signed)
Please stop taking the alogliptin immediately.  Drink plenty of fluids, rest for the next couple of days and follow-up with your doctor.  Thankfully there was no signs of any other problems including heart attacks or other heart problems.  Return to the emergency department immediately for severe or worsening symptoms.

## 2019-09-16 ENCOUNTER — Emergency Department (HOSPITAL_COMMUNITY)
Admission: EM | Admit: 2019-09-16 | Discharge: 2019-09-16 | Discharge status: Against Medical Advice | Payer: No Typology Code available for payment source

## 2019-09-16 ENCOUNTER — Other Ambulatory Visit: Payer: Self-pay

## 2019-09-16 NOTE — ED Triage Notes (Signed)
Left prior to triage 

## 2019-12-31 ENCOUNTER — Encounter (HOSPITAL_COMMUNITY): Payer: Self-pay | Admitting: *Deleted

## 2019-12-31 ENCOUNTER — Emergency Department (HOSPITAL_COMMUNITY): Payer: No Typology Code available for payment source

## 2019-12-31 ENCOUNTER — Other Ambulatory Visit: Payer: Self-pay

## 2019-12-31 ENCOUNTER — Emergency Department (HOSPITAL_COMMUNITY)
Admission: EM | Admit: 2019-12-31 | Discharge: 2019-12-31 | Disposition: A | Discharge status: Home / Self Care | Payer: No Typology Code available for payment source | Attending: Emergency Medicine | Admitting: Emergency Medicine

## 2019-12-31 DIAGNOSIS — Z950 Presence of cardiac pacemaker: Secondary | ICD-10-CM | POA: Diagnosis not present

## 2019-12-31 DIAGNOSIS — Z7982 Long term (current) use of aspirin: Secondary | ICD-10-CM | POA: Insufficient documentation

## 2019-12-31 DIAGNOSIS — Z7984 Long term (current) use of oral hypoglycemic drugs: Secondary | ICD-10-CM | POA: Diagnosis not present

## 2019-12-31 DIAGNOSIS — Z87891 Personal history of nicotine dependence: Secondary | ICD-10-CM | POA: Insufficient documentation

## 2019-12-31 DIAGNOSIS — R0789 Other chest pain: Secondary | ICD-10-CM | POA: Diagnosis present

## 2019-12-31 DIAGNOSIS — R079 Chest pain, unspecified: Secondary | ICD-10-CM

## 2019-12-31 DIAGNOSIS — E785 Hyperlipidemia, unspecified: Secondary | ICD-10-CM | POA: Diagnosis not present

## 2019-12-31 DIAGNOSIS — I25119 Atherosclerotic heart disease of native coronary artery with unspecified angina pectoris: Secondary | ICD-10-CM | POA: Insufficient documentation

## 2019-12-31 DIAGNOSIS — E1169 Type 2 diabetes mellitus with other specified complication: Secondary | ICD-10-CM | POA: Diagnosis not present

## 2019-12-31 DIAGNOSIS — Z20822 Contact with and (suspected) exposure to covid-19: Secondary | ICD-10-CM | POA: Diagnosis not present

## 2019-12-31 DIAGNOSIS — I209 Angina pectoris, unspecified: Secondary | ICD-10-CM

## 2019-12-31 LAB — CBC
HCT: 44.9 % (ref 39.0–52.0)
Hemoglobin: 14.7 g/dL (ref 13.0–17.0)
MCH: 31.4 pg (ref 26.0–34.0)
MCHC: 32.7 g/dL (ref 30.0–36.0)
MCV: 95.9 fL (ref 80.0–100.0)
Platelets: 140 10*3/uL — ABNORMAL LOW (ref 150–400)
RBC: 4.68 MIL/uL (ref 4.22–5.81)
RDW: 13.4 % (ref 11.5–15.5)
WBC: 4.9 10*3/uL (ref 4.0–10.5)
nRBC: 0 % (ref 0.0–0.2)

## 2019-12-31 LAB — BASIC METABOLIC PANEL
Anion gap: 11 (ref 5–15)
BUN: 19 mg/dL (ref 8–23)
CO2: 22 mmol/L (ref 22–32)
Calcium: 9.1 mg/dL (ref 8.9–10.3)
Chloride: 105 mmol/L (ref 98–111)
Creatinine, Ser: 1.32 mg/dL — ABNORMAL HIGH (ref 0.61–1.24)
GFR, Estimated: 57 mL/min — ABNORMAL LOW (ref >60)
Glucose, Bld: 181 mg/dL — ABNORMAL HIGH (ref 70–99)
Potassium: 4.5 mmol/L (ref 3.5–5.1)
Sodium: 138 mmol/L (ref 135–145)

## 2019-12-31 LAB — RESP PANEL BY RT-PCR (FLU A&B, COVID) ARPGX2
Influenza A by PCR: NEGATIVE
Influenza B by PCR: NEGATIVE
SARS Coronavirus 2 by RT PCR: NEGATIVE

## 2019-12-31 LAB — TROPONIN I (HIGH SENSITIVITY)
Troponin I (High Sensitivity): 4 ng/L (ref <18)
Troponin I (High Sensitivity): 5 ng/L (ref <18)

## 2019-12-31 MED ORDER — NITROGLYCERIN 0.4 MG SL SUBL
0.4000 mg | SUBLINGUAL_TABLET | SUBLINGUAL | Status: DC | PRN
  Administered 2019-12-31 (×3): 0.4 mg via SUBLINGUAL
  Filled 2019-12-31: qty 1

## 2019-12-31 NOTE — ED Provider Notes (Addendum)
[facility] EMERGENCY DEPARTMENT Provider Note   CSN: [id number] Arrival date & time: 12/31/19  1843     History No chief complaint on file.   Marcus Patterson is a 74 y.o. male.  HPI  HPI: A 74 year old patient with a history of CVA, hypertension, hypercholesterolemia and obesity presents for evaluation of chest pain. Initial onset of pain was approximately 3-6 hours ago. The patient's chest pain is described as heaviness/pressure/tightness and is not worse with exertion. The patient's chest pain is middle- or left-sided, is not well-localized, is not sharp and does not radiate to the arms/jaw/neck. The patient does not complain of nausea and denies diaphoresis. The patient has no history of peripheral artery disease, has not smoked in the past 90 days, denies any history of treated diabetes and has no relevant family history of coronary artery disease (first degree relative at less than age 93).   Patient has history of CAD with stent placement and he also has a pacemaker in place. Patient reports that he woke up this morning with chest pain around his pacemaker site.  That pain was tolerable and resolved.  Around 6 PM he had sudden, severe chest pain that was midsternal.  Pain is nonradiating.  No associated nausea, vomiting, sweating, shortness of breath.  The pain is severe and reminded him of heart attack.  He takes daily aspirin.  He did not take any nitroglycerin at home.  Patient also reports having some cough.  No sick contacts.  Patient is vaccinated.  Patient denies any pain with deep inspiration, fevers, chills.  Past Medical History:  Diagnosis Date  . Agent orange exposure   . Coronary artery disease   . Diabetes mellitus   . Headache   . Hyperlipidemia   . Hypertension   . Myocardial infarction [facility]) 2009   X3 -stent placed and pacemaker (2011)  . PTSD (post-traumatic stress disorder)   . Vertigo     Patient Active Problem List   Diagnosis Date Noted  . Dyspnea  on exertion 03/03/2013  . Diabetes (HCC) 03/03/2013  . Essential hypertension, benign 03/03/2013  . Other and unspecified hyperlipidemia 03/03/2013  . CAD (coronary artery disease) 03/03/2013  . Other malaise and fatigue 03/03/2013  . DOE (dyspnea on exertion) 03/03/2013    Past Surgical History:  Procedure Laterality Date  . BACK SURGERY    . CAROTID STENT    . KNEE SURGERY    . PACEMAKER INSERTION         History reviewed. No pertinent family history.  Social History   Tobacco Use  . Smoking status: Former Smoker    Packs/day: 3.00    Years: 40.00    Pack years: 120.00    Types: Cigarettes    Quit date: 01/03/2007    Years since quitting: 13.0  . Smokeless tobacco: Former Neurosurgeon    Types: Chew, Snuff    Quit date: 01/03/2011  Vaping Use  . Vaping Use: Never used  Substance Use Topics  . Alcohol use: No  . Drug use: No    Home Medications Prior to Admission medications   Medication Sig Start Date End Date Taking? Authorizing Provider  aspirin 325 MG tablet Take 325 mg by mouth daily.   Yes [provider]  atorvastatin (LIPITOR) 80 MG tablet Take 80 mg by mouth at bedtime.    Yes [provider]  cetirizine (ZYRTEC) 10 MG tablet Take 10 mg by mouth daily.   Yes [provider]  cholecalciferol (  VITAMIN D) 1000 units tablet Take 1,000 Units by mouth daily.   Yes [provider]  Cyanocobalamin (VITAMIN B 12 PO) Take 1,000 mcg by mouth every evening.    Yes [provider]  docusate sodium (COLACE) 100 MG capsule Take 300 mg by mouth daily.   Yes [provider]  EPINEPHrine 0.3 mg/0.3 mL IJ SOAJ injection Inject 0.3 mg into the muscle once.   Yes [provider]  hydrOXYzine (ATARAX/VISTARIL) 25 MG tablet Take 1 tablet (25 mg total) by mouth every 6 (six) hours as needed for itching. 07/22/17  Yes Long, Arlyss RepressJoshua G, MD  ibuprofen (ADVIL,MOTRIN) 600 MG tablet Take 600 mg by mouth 2 (two) times daily as needed for  mild pain or moderate pain.   Yes [provider]  meclizine (ANTIVERT) 25 MG tablet Take 1 tablet (25 mg total) by mouth 3 (three) times daily as needed for dizziness. 03/27/18  Yes Samuel JesterMcManus, Kathleen, DO  metFORMIN (GLUCOPHAGE-XR) 500 MG 24 hr tablet Take 500 mg by mouth 2 (two) times daily.   Yes [provider]  metoprolol succinate (TOPROL-XL) 50 MG 24 hr tablet Take 25 mg by mouth at bedtime. Take with or immediately following a meal.   Yes [provider]  nitroGLYCERIN (NITROSTAT) 0.4 MG SL tablet Place 1 tablet (0.4 mg total) under the tongue every 5 (five) minutes as needed for chest pain. 10/06/16  Yes Devoria AlbeKnapp, Iva, MD  Omega-3 Fatty Acids (FISH OIL PO) Take 2,000 mg by mouth 2 (two) times daily.    Yes [provider]  omeprazole (PRILOSEC) 20 MG capsule Take 20 mg by mouth every morning.   Yes [provider]  ondansetron (ZOFRAN ODT) 4 MG disintegrating tablet Take 1 tablet (4 mg total) by mouth every 8 (eight) hours as needed for nausea or vomiting. 03/27/18  Yes Samuel JesterMcManus, Kathleen, DO  polyethylene glycol (MIRALAX / GLYCOLAX) packet Take 17 g by mouth daily.    Yes [provider]  ranitidine (ZANTAC) 150 MG tablet Take 150 mg by mouth 2 (two) times daily.   Yes [provider]  albuterol (PROVENTIL HFA;VENTOLIN HFA) 108 (90 BASE) MCG/ACT inhaler Inhale 1-2 puffs into the lungs every 6 (six) hours as needed for wheezing or shortness of breath. Patient not taking: No sig reported    [provider]    Allergies    Patient has no known allergies.  Review of Systems   Review of Systems  Constitutional: Positive for activity change.  Respiratory: Positive for cough.   Cardiovascular: Positive for chest pain.  Gastrointestinal: Negative for nausea and vomiting.  All other systems reviewed and are negative.   Physical Exam Updated Vital Signs BP 101/73   Pulse 70   Temp 97.7 F (36.5 C) (Oral)   Resp (!) 25    Ht 5\' 8"  (1.727 m)   Wt 87.1 kg   SpO2 97%   BMI 29.19 kg/m   Physical Exam Vitals and nursing note reviewed.  Constitutional:      Appearance: He is well-developed.  HENT:     Head: Atraumatic.  Cardiovascular:     Rate and Rhythm: Normal rate.     Pulses: Normal pulses.  Pulmonary:     Effort: Pulmonary effort is normal.  Musculoskeletal:     Cervical back: Neck supple.  Skin:    General: Skin is warm.     Capillary Refill: Capillary refill takes less than 2 seconds.  Neurological:     Mental  Status: He is alert and oriented to person, place, and time.     ED Results / Procedures / Treatments   Labs (all labs ordered are listed, but only abnormal results are displayed) Labs Reviewed  BASIC METABOLIC PANEL - Abnormal; Notable for the following components:      Result Value   Glucose, Bld 181 (*)    Creatinine, Ser 1.32 (*)    GFR, Estimated 57 (*)    All other components within normal limits  CBC - Abnormal; Notable for the following components:   Platelets 140 (*)    All other components within normal limits  RESP PANEL BY RT-PCR (FLU A&B, COVID) ARPGX2  TROPONIN I (HIGH SENSITIVITY)  TROPONIN I (HIGH SENSITIVITY)    EKG EKG Interpretation  Date/Time:  Wednesday December 31 2019 18:47:05 EST Ventricular Rate:  65 PR Interval:    QRS Duration: 160 QT Interval:  462 QTC Calculation: 480 R Axis:   153 Text Interpretation: Ventricular-paced rhythm Abnormal ECG VENTRICULAR PACED RHYTHM No significant change since last tracing Confirmed by Derwood Kaplan ([id number]) on 12/31/2019 8:34:52 PM   Radiology DG Chest Portable 1 View  Result Date: 12/31/2019 CLINICAL DATA:  Chest pain. EXAM: PORTABLE CHEST 1 VIEW COMPARISON:  10/27/2018 FINDINGS: Dual lead left-sided pacemaker in place. The heart is normal in size with unchanged mediastinal contours. No pulmonary edema. No focal airspace disease. No pleural fluid or pneumothorax. Chronic widening of the left  acromioclavicular joint. No acute osseous abnormalities are seen. IMPRESSION: 1. No acute chest findings. 2. Left-sided pacemaker in place. Electronically Signed   By: Narda Rutherford M.D.   On: 12/31/2019 19:30    Procedures Procedures (including critical care time)  Medications Ordered in ED Medications  nitroGLYCERIN (NITROSTAT) SL tablet 0.4 mg (0.4 mg Sublingual Given 12/31/19 2015)    ED Course  I have reviewed the triage vital signs and the nursing notes.  Pertinent labs & imaging results that were available during my care of the patient were reviewed by me and considered in my medical decision making (see chart for details).  Clinical Course as of 12/31/19 2244  Wed Dec 31, 2019  2241 Troponin I (High Sensitivity): 5 High-sensitivity troponin negative Negative Results of the ER work-up discussed with the patient.  I informed her that my suspicion for ACS is extremely low, given that he has been with chest pain > 6 hours.  In the ER his chest pain has resolved.  Patient indicates that he has rapid access cardiology again [facility].  He is in agreement with the plan of following up with.  I discussed with him the need for him to return to the ER if he starts having chest pain prior to the appointment, and patient is in full agreement with that as well.  The patient appears reasonably screened and/or stabilized for discharge and I doubt any other medical condition or other [facility] requiring further screening, evaluation, or treatment in the ED at this time prior to discharge.  Results from the ER workup discussed with the patient face to face and all questions answered to the best of my ability. The patient is safe for discharge with strict return precautions.   [AN]    Clinical Course User Index [AN] Derwood Kaplan, MD   MDM Rules/Calculators/A&P HEAR Score: 74                        74 year old male comes in a chief complaint of chest pain.  He has no history of CAD.  No  recent cardiac work-up.  It does not appear that patient has been having any chest pain or shortness of breath with exertion.  Today's pain is atypical for him, but he states that the quality of pain is similar to his previous heart attack.  Patient is not the best historian, which makes history a little bit unreliable.  Still, ACS is high in the differential diagnosis along with musculoskeletal pain, pleurisy, COVID-19.   EKG showing paced rhythm, therefore it does not provide any conclusive answers on STEMI.  Nitroglycerin given.  Patient already took his home aspirin prior to ED arrival.  We will get delta troponin and reassess .  Final Clinical Impression(s) / ED Diagnoses Final diagnoses:  Acute chest pain  Angina pectoris [facility])    Rx / DC Orders ED Discharge Orders    None       Derwood Kaplan, MD 12/31/19 2039    Derwood Kaplan, MD 12/31/19 2244

## 2019-12-31 NOTE — ED Triage Notes (Signed)
Pt with mid CP ongoing since this morning.  Pt with a pacemaker and states battery is getting low.  Pt with SOB as well, denies N/V.   HA this morning and sore throat.

## 2019-12-31 NOTE — Discharge Instructions (Signed)
We saw you in the ER for the chest pain/shortness of breath. All of our cardiac workup is normal, including labs, EKG and chest X-RAY are normal. We are not sure what is causing your discomfort, but we feel comfortable sending you home at this time. The workup in the ER is not complete, and you should [facility]  Please return to the ER if you have worsening chest pain, shortness of breath, pain radiating to your jaw, shoulder, or back, sweats or fainting. Otherwise see the Cardiologist or your primary care doctor as requested.

## 2020-01-03 HISTORY — PX: PACEMAKER IMPLANT: EP1218

## 2020-05-07 ENCOUNTER — Ambulatory Visit
Admission: EM | Admit: 2020-05-07 | Discharge: 2020-05-07 | Disposition: A | Discharge status: Home / Self Care | Payer: No Typology Code available for payment source

## 2020-05-07 ENCOUNTER — Other Ambulatory Visit: Payer: Self-pay

## 2020-07-18 ENCOUNTER — Encounter (HOSPITAL_COMMUNITY): Payer: Self-pay

## 2020-07-18 ENCOUNTER — Other Ambulatory Visit: Payer: Self-pay

## 2020-07-18 ENCOUNTER — Emergency Department (HOSPITAL_COMMUNITY): Payer: No Typology Code available for payment source

## 2020-07-18 ENCOUNTER — Emergency Department (HOSPITAL_COMMUNITY)
Admission: EM | Admit: 2020-07-18 | Discharge: 2020-07-18 | Disposition: A | Discharge status: Home / Self Care | Payer: No Typology Code available for payment source | Attending: Emergency Medicine | Admitting: Emergency Medicine

## 2020-07-18 DIAGNOSIS — I2510 Atherosclerotic heart disease of native coronary artery without angina pectoris: Secondary | ICD-10-CM | POA: Insufficient documentation

## 2020-07-18 DIAGNOSIS — Z950 Presence of cardiac pacemaker: Secondary | ICD-10-CM | POA: Diagnosis not present

## 2020-07-18 DIAGNOSIS — T7840XA Allergy, unspecified, initial encounter: Secondary | ICD-10-CM | POA: Diagnosis not present

## 2020-07-18 DIAGNOSIS — I10 Essential (primary) hypertension: Secondary | ICD-10-CM | POA: Insufficient documentation

## 2020-07-18 DIAGNOSIS — T782XXA Anaphylactic shock, unspecified, initial encounter: Secondary | ICD-10-CM | POA: Diagnosis not present

## 2020-07-18 DIAGNOSIS — Z7984 Long term (current) use of oral hypoglycemic drugs: Secondary | ICD-10-CM | POA: Insufficient documentation

## 2020-07-18 DIAGNOSIS — Z7982 Long term (current) use of aspirin: Secondary | ICD-10-CM | POA: Diagnosis not present

## 2020-07-18 DIAGNOSIS — E119 Type 2 diabetes mellitus without complications: Secondary | ICD-10-CM | POA: Diagnosis not present

## 2020-07-18 DIAGNOSIS — Z87891 Personal history of nicotine dependence: Secondary | ICD-10-CM | POA: Diagnosis not present

## 2020-07-18 DIAGNOSIS — Z79899 Other long term (current) drug therapy: Secondary | ICD-10-CM | POA: Insufficient documentation

## 2020-07-18 DIAGNOSIS — R0602 Shortness of breath: Secondary | ICD-10-CM | POA: Diagnosis present

## 2020-07-18 LAB — CBC WITH DIFFERENTIAL/PLATELET
Abs Immature Granulocytes: 0.01 10*3/uL (ref 0.00–0.07)
Basophils Absolute: 0 10*3/uL (ref 0.0–0.1)
Basophils Relative: 0 %
Eosinophils Absolute: 0.1 10*3/uL (ref 0.0–0.5)
Eosinophils Relative: 1 %
HCT: 47.9 % (ref 39.0–52.0)
Hemoglobin: 16.5 g/dL (ref 13.0–17.0)
Immature Granulocytes: 0 %
Lymphocytes Relative: 58 %
Lymphs Abs: 3.4 10*3/uL (ref 0.7–4.0)
MCH: 32.5 pg (ref 26.0–34.0)
MCHC: 34.4 g/dL (ref 30.0–36.0)
MCV: 94.3 fL (ref 80.0–100.0)
Monocytes Absolute: 0.5 10*3/uL (ref 0.1–1.0)
Monocytes Relative: 9 %
Neutro Abs: 1.9 10*3/uL (ref 1.7–7.7)
Neutrophils Relative %: 32 %
Platelets: 218 10*3/uL (ref 150–400)
RBC: 5.08 MIL/uL (ref 4.22–5.81)
RDW: 13.6 % (ref 11.5–15.5)
WBC: 5.9 10*3/uL (ref 4.0–10.5)
nRBC: 0 % (ref 0.0–0.2)

## 2020-07-18 LAB — BASIC METABOLIC PANEL
Anion gap: 7 (ref 5–15)
BUN: 19 mg/dL (ref 8–23)
CO2: 23 mmol/L (ref 22–32)
Calcium: 9.2 mg/dL (ref 8.9–10.3)
Chloride: 108 mmol/L (ref 98–111)
Creatinine, Ser: 1.03 mg/dL (ref 0.61–1.24)
GFR, Estimated: >60 mL/min (ref >60)
Glucose, Bld: 181 mg/dL — ABNORMAL HIGH (ref 70–99)
Potassium: 4 mmol/L (ref 3.5–5.1)
Sodium: 138 mmol/L (ref 135–145)

## 2020-07-18 LAB — TROPONIN I (HIGH SENSITIVITY)
Troponin I (High Sensitivity): 4 ng/L (ref <18)
Troponin I (High Sensitivity): 8 ng/L (ref <18)

## 2020-07-18 MED ORDER — EPINEPHRINE 0.3 MG/0.3ML IJ SOAJ
0.3000 mg | Freq: Once | INTRAMUSCULAR | Status: AC
  Administered 2020-07-18: 0.3 mg via INTRAMUSCULAR
  Filled 2020-07-18: qty 0.3

## 2020-07-18 MED ORDER — METHYLPREDNISOLONE SODIUM SUCC 125 MG IJ SOLR
125.0000 mg | Freq: Once | INTRAMUSCULAR | Status: AC
  Administered 2020-07-18: 125 mg via INTRAVENOUS
  Filled 2020-07-18: qty 2

## 2020-07-18 MED ORDER — FAMOTIDINE IN NACL 20-0.9 MG/50ML-% IV SOLN
20.0000 mg | Freq: Once | INTRAVENOUS | Status: AC
  Administered 2020-07-18: 20 mg via INTRAVENOUS
  Filled 2020-07-18: qty 50

## 2020-07-18 MED ORDER — PREDNISONE 10 MG (21) PO TBPK: ORAL_TABLET | ORAL | 0 refills | Status: AC

## 2020-07-18 MED ORDER — DIPHENHYDRAMINE HCL 50 MG/ML IJ SOLN
50.0000 mg | Freq: Once | INTRAMUSCULAR | Status: AC
  Administered 2020-07-18: 50 mg via INTRAVENOUS
  Filled 2020-07-18: qty 1

## 2020-07-18 MED ORDER — EPINEPHRINE 0.3 MG/0.3ML IJ SOAJ: 0.3000 mg | INTRAMUSCULAR | 0 refills | Status: AC | PRN

## 2020-07-18 NOTE — ED Provider Notes (Signed)
[facility] EMERGENCY DEPARTMENT Provider Note  CSN: [id number] Arrival date & time: 07/18/20 1640    History Chief Complaint  Patient presents with   Chest Pain    Marcus Patterson is a 75 y.o. male with history of CAD, MI with stent and pacemaker reports sudden onset of tongue burning, itchy skin, SOB and chest tightness about prior to arrival while eating walnuts. He has never  had a reaction to walnuts before. He has history of shellfish allergies, has EpiPen in med list but does not think he has ever used one. He denies any fever, cough, N/V/D.    Past Medical History:  Diagnosis Date   Agent orange exposure    Coronary artery disease    Diabetes mellitus    Headache    Hyperlipidemia    Hypertension    Myocardial infarction [facility]) 2009   X3 -stent placed and pacemaker (2011)   PTSD (post-traumatic stress disorder)    Vertigo     Past Surgical History:  Procedure Laterality Date   BACK SURGERY     CAROTID STENT     KNEE SURGERY     PACEMAKER INSERTION      History reviewed. No pertinent family history.  Social History   Tobacco Use   Smoking status: Former    Packs/day: 3.00    Years: 40.00    Pack years: 120.00    Types: Cigarettes    Quit date: 01/03/2007    Years since quitting: 13.5   Smokeless tobacco: Former    Types: Chew, Snuff    Quit date: 01/03/2011  Vaping Use   Vaping Use: Never used  Substance Use Topics   Alcohol use: No   Drug use: No     Home Medications Prior to Admission medications   Medication Sig Start Date End Date Taking? Authorizing Provider  albuterol (PROVENTIL HFA;VENTOLIN HFA) 108 (90 BASE) MCG/ACT inhaler Inhale 1-2 puffs into the lungs every 6 (six) hours as needed for wheezing or shortness of breath. Patient not taking: No sig reported    [provider]  aspirin 325 MG tablet Take 325 mg by mouth daily.    [provider]  atorvastatin (LIPITOR) 80 MG tablet Take 80 mg by mouth at bedtime.      [provider]  cetirizine (ZYRTEC) 10 MG tablet Take 10 mg by mouth daily.    [provider]  cholecalciferol (VITAMIN D) 1000 units tablet Take 1,000 Units by mouth daily.    [provider]  Cyanocobalamin (VITAMIN B 12 PO) Take 1,000 mcg by mouth every evening.     [provider]  docusate sodium (COLACE) 100 MG capsule Take 300 mg by mouth daily.    [provider]  EPINEPHrine 0.3 mg/0.3 mL IJ SOAJ injection Inject 0.3 mg into the muscle once.    [provider]  hydrOXYzine (ATARAX/VISTARIL) 25 MG tablet Take 1 tablet (25 mg total) by mouth every 6 (six) hours as needed for itching. 07/22/17   Long, Arlyss Repress, MD  ibuprofen (ADVIL,MOTRIN) 600 MG tablet Take 600 mg by mouth 2 (two) times daily as needed for mild pain or moderate pain.    [provider]  meclizine (ANTIVERT) 25 MG tablet Take 1 tablet (25 mg total) by mouth 3 (three) times daily as needed for dizziness. 03/27/18   Samuel Jester, DO  metFORMIN (GLUCOPHAGE-XR) 500 MG 24 hr tablet Take 500 mg by mouth 2 (two) times daily.  [provider]  metoprolol succinate (TOPROL-XL) 50 MG 24 hr tablet Take 25 mg by mouth at bedtime. Take with or immediately following a meal.    [provider]  nitroGLYCERIN (NITROSTAT) 0.4 MG SL tablet Place 1 tablet (0.4 mg total) under the tongue every 5 (five) minutes as needed for chest pain. 10/06/16   Devoria Albe, MD  Omega-3 Fatty Acids (FISH OIL PO) Take 2,000 mg by mouth 2 (two) times daily.     [provider]  omeprazole (PRILOSEC) 20 MG capsule Take 20 mg by mouth every morning.    [provider]  ondansetron (ZOFRAN ODT) 4 MG disintegrating tablet Take 1 tablet (4 mg total) by mouth every 8 (eight) hours as needed for nausea or vomiting. 03/27/18   Samuel Jester, DO  polyethylene glycol (MIRALAX / GLYCOLAX) packet Take 17 g by mouth daily.     [provider]  ranitidine (ZANTAC)  150 MG tablet Take 150 mg by mouth 2 (two) times daily.    [provider]     Allergies    Patient has no known allergies.   Review of Systems   Review of Systems A comprehensive review of systems was completed and negative except as noted in HPI.    Physical Exam BP 133/75   Pulse 60   Resp 19   Ht 5\' 8"  (1.727 m)   Wt 83.5 kg   SpO2 92%   BMI 27.98 kg/m   Physical Exam Vitals and nursing note reviewed.  Constitutional:      Appearance: Normal appearance.  HENT:     Head: Normocephalic and atraumatic.     Nose: Nose normal.     Mouth/Throat:     Mouth: Mucous membranes are moist.     Comments: No significant angioedema Eyes:     Extraocular Movements: Extraocular movements intact.     Conjunctiva/sclera: Conjunctivae normal.  Cardiovascular:     Rate and Rhythm: Normal rate.  Pulmonary:     Effort: Pulmonary effort is normal.     Breath sounds: Normal breath sounds. No stridor.  Abdominal:     General: Abdomen is flat.     Palpations: Abdomen is soft.     Tenderness: There is no abdominal tenderness.  Musculoskeletal:        General: No swelling. Normal range of motion.     Cervical back: Neck supple.  Skin:    General: Skin is warm and dry.  Neurological:     General: No focal deficit present.     Mental Status: He is alert.  Psychiatric:        Mood and Affect: Mood normal.     ED Results / Procedures / Treatments   Labs (all labs ordered are listed, but only abnormal results are displayed) Labs Reviewed  BASIC METABOLIC PANEL - Abnormal; Notable for the following components:      Result Value   Glucose, Bld 181 (*)    All other components within normal limits  CBC WITH DIFFERENTIAL/PLATELET  TROPONIN I (HIGH SENSITIVITY)  TROPONIN I (HIGH SENSITIVITY)    EKG EKG Interpretation  Date/Time:  Sunday July 18 2020 16:47:15 EDT Ventricular Rate:  72 PR Interval:  158 QRS Duration: 120 QT Interval:  420 QTC Calculation: 460 R  Axis:   83 Text Interpretation: Sinus rhythm Atrial premature complex IVCD, consider atypical RBBB Since last tracing Sinus rhythm has replaced VENTRICULAR PACED RHYTHM Confirmed by 03-30-1993 [id number]) on 07/18/2020 5:05:06 PM  Radiology DG Chest Port 1 View  Result Date: 07/18/2020 CLINICAL DATA:  Allergic reaction. EXAM: PORTABLE CHEST 1 VIEW COMPARISON:  December 31, 2019 FINDINGS: Two lead cardiac pacemaker in stable position. Cardiomediastinal silhouette is normal. Mediastinal contours appear intact. There is no evidence of focal airspace consolidation, pleural effusion or pneumothorax. Osseous structures are without acute abnormality. Soft tissues are grossly normal. IMPRESSION: No active disease. Electronically Signed   By: Ted Mcalpine M.D.   On: 07/18/2020 17:36    Procedures .Critical Care  Date/Time: 07/18/2020 9:10 PM Performed by: Pollyann Savoy, MD Authorized by: Pollyann Savoy, MD   Critical care provider statement:    Critical care time (minutes):  45   Critical care time was exclusive of:  Separately billable procedures and treating other patients   Critical care was necessary to treat or prevent imminent or life-threatening deterioration of the following conditions:  Respiratory failure   Critical care was time spent personally by me on the following activities:  Discussions with consultants, evaluation of patient's response to treatment, examination of patient, ordering and performing treatments and interventions, ordering and review of laboratory studies, ordering and review of radiographic studies, pulse oximetry, re-evaluation of patient's condition, obtaining history from patient or surrogate and review of old charts  Medications Ordered in the ED Medications  diphenhydrAMINE (BENADRYL) injection 50 mg (50 mg Intravenous Given 07/18/20 1705)  methylPREDNISolone sodium succinate (SOLU-MEDROL) 125 mg/2 mL injection 125 mg (125 mg Intravenous Given 07/18/20  1706)  famotidine (PEPCID) IVPB 20 mg premix (0 mg Intravenous Stopped 07/18/20 2007)  EPINEPHrine (EPI-PEN) injection 0.3 mg (0.3 mg Intramuscular Given 07/18/20 1721)     MDM Rules/Calculators/A&P MDM Patient with symptoms concerning for allergic reaction. He does not currently have any evidence of airway involvement. Will give Solumedrol, Benadryl and Pepcid now, hold off on Epi for the time being given no signs of airway involvement or shock and significant cardiac history. Less likely this is a primary cardiac problem but will check EKG, CXR and labs to evaluate.   ED Course  I have reviewed the triage vital signs and the nursing notes.  Pertinent labs & imaging results that were available during my care of the patient were reviewed by me and considered in my medical decision making (see chart for details).  Clinical Course as of 07/18/20 2111  Wynelle Link Jul 18, 2020  1708 CBC is normal.  [CS]  1714 Patient reports he is feeling worse after benadryl, having some mild angioedema of his tongue now. Will go ahead with EpiPen, patient and wife at bedside aware of risks and benefits.  [CS]  1738 BMP and Trop #1 are normal.  [CS]  1743 CXR is clear. Patient having some tremors after epi. Mild improvement in tongue swelling.  [CS]  1958 Patient reports he is feeling better. Tongue swelling has improved. Repeat Trop is neg. Will be observed until 4 hours post-epinephrine.  [CS]  2109 Patient remains asymptomatic and is eager to go home. Rx for pred-pak. PCP follow up. Avoid walnuts as this may have triggered his allergic reaction.  [CS]    Clinical Course User Index [CS] Pollyann Savoy, MD    Final Clinical Impression(s) / ED Diagnoses Final diagnoses:  Allergic reaction, initial encounter  Anaphylaxis, initial encounter    Rx / DC Orders ED Discharge Orders     None        Pollyann Savoy, MD 07/18/20 2111

## 2020-07-18 NOTE — ED Triage Notes (Signed)
Pt c/o chest pain, shortness of breath, and itching starting 20 min PTA.

## 2021-03-10 ENCOUNTER — Ambulatory Visit
Admission: EM | Admit: 2021-03-10 | Discharge: 2021-03-10 | Disposition: A | Discharge status: Home / Self Care | Payer: No Typology Code available for payment source | Attending: Urgent Care | Admitting: Urgent Care

## 2021-03-10 ENCOUNTER — Other Ambulatory Visit: Payer: Self-pay

## 2021-03-10 DIAGNOSIS — R52 Pain, unspecified: Secondary | ICD-10-CM

## 2021-03-10 DIAGNOSIS — I519 Heart disease, unspecified: Secondary | ICD-10-CM

## 2021-03-10 DIAGNOSIS — R6883 Chills (without fever): Secondary | ICD-10-CM | POA: Diagnosis not present

## 2021-03-10 DIAGNOSIS — E119 Type 2 diabetes mellitus without complications: Secondary | ICD-10-CM

## 2021-03-10 DIAGNOSIS — R0981 Nasal congestion: Secondary | ICD-10-CM

## 2021-03-10 DIAGNOSIS — Z794 Long term (current) use of insulin: Secondary | ICD-10-CM

## 2021-03-10 DIAGNOSIS — B349 Viral infection, unspecified: Secondary | ICD-10-CM | POA: Diagnosis not present

## 2021-03-10 DIAGNOSIS — I252 Old myocardial infarction: Secondary | ICD-10-CM

## 2021-03-10 MED ORDER — BENZONATATE 100 MG PO CAPS: 100.0000 mg | ORAL_CAPSULE | Freq: Three times a day (TID) | ORAL | 0 refills | Status: AC | PRN

## 2021-03-10 MED ORDER — PROMETHAZINE-DM 6.25-15 MG/5ML PO SYRP: 5.0000 mL | ORAL_SOLUTION | Freq: Every evening | ORAL | 0 refills | Status: AC | PRN

## 2021-03-10 MED ORDER — IPRATROPIUM BROMIDE 0.03 % NA SOLN: 2.0000 | Freq: Two times a day (BID) | NASAL | 0 refills | Status: AC

## 2021-03-10 MED ORDER — LEVOCETIRIZINE DIHYDROCHLORIDE 5 MG PO TABS: 5.0000 mg | ORAL_TABLET | Freq: Every evening | ORAL | 0 refills | Status: AC

## 2021-03-10 NOTE — ED Triage Notes (Signed)
Pt reports nasal congestion., headache, chills and body aches x 4 days.  ? ?Pt requested COVID test.  ?

## 2021-03-10 NOTE — ED Provider Notes (Signed)
?[facility] ? ? ?MRN: [id number] DOB: 1945/05/13 ? ?Subjective:  ? ?Marcus Patterson is a 76 y.o. male presenting for 4-day history of acute onset sinus congestion, sinus headaches, body aches and chills.  He has had a slight cough.  No chest pain, shortness of breath or wheezing.  No history of asthma.  Patient is a former smoker.  He does have diabetes, no insulin.  Has a history of heart disease, MI.  Patient would like a COVID test.  Left facial pain, ear pain, throat pain, painful swallowing. ? ?No current facility-administered medications for this encounter. ? ?Current Outpatient Medications:  ?  albuterol (PROVENTIL HFA;VENTOLIN HFA) 108 (90 BASE) MCG/ACT inhaler, Inhale 1-2 puffs into the lungs every 6 (six) hours as needed for wheezing or shortness of breath. (Patient not taking: No sig reported), Disp: , Rfl:  ?  aspirin 325 MG tablet, Take 325 mg by mouth daily., Disp: , Rfl:  ?  atorvastatin (LIPITOR) 80 MG tablet, Take 80 mg by mouth at bedtime. , Disp: , Rfl:  ?  cetirizine (ZYRTEC) 10 MG tablet, Take 10 mg by mouth daily., Disp: , Rfl:  ?  cholecalciferol (VITAMIN D) 1000 units tablet, Take 1,000 Units by mouth daily., Disp: , Rfl:  ?  Cyanocobalamin (VITAMIN B 12 PO), Take 1,000 mcg by mouth every evening. , Disp: , Rfl:  ?  docusate sodium (COLACE) 100 MG capsule, Take 300 mg by mouth daily., Disp: , Rfl:  ?  EPINEPHrine 0.3 mg/0.3 mL IJ SOAJ injection, Inject 0.3 mg into the muscle once., Disp: , Rfl:  ?  EPINEPHrine 0.3 mg/0.3 mL IJ SOAJ injection, Inject 0.3 mg into the muscle as needed for anaphylaxis., Disp: 1 each, Rfl: 0 ?  hydrOXYzine (ATARAX/VISTARIL) 25 MG tablet, Take 1 tablet (25 mg total) by mouth every 6 (six) hours as needed for itching., Disp: 12 tablet, Rfl: 0 ?  ibuprofen (ADVIL,MOTRIN) 600 MG tablet, Take 600 mg by mouth 2 (two) times daily as needed for mild pain or moderate pain., Disp: , Rfl:  ?  meclizine (ANTIVERT) 25 MG tablet, Take 1 tablet (25 mg total)  by mouth 3 (three) times daily as needed for dizziness., Disp: 15 tablet, Rfl: 0 ?  metFORMIN (GLUCOPHAGE-XR) 500 MG 24 hr tablet, Take 500 mg by mouth 2 (two) times daily., Disp: , Rfl:  ?  metoprolol succinate (TOPROL-XL) 50 MG 24 hr tablet, Take 25 mg by mouth at bedtime. Take with or immediately following a meal., Disp: , Rfl:  ?  nitroGLYCERIN (NITROSTAT) 0.4 MG SL tablet, Place 1 tablet (0.4 mg total) under the tongue every 5 (five) minutes as needed for chest pain., Disp: 30 tablet, Rfl: 0 ?  Omega-3 Fatty Acids (FISH OIL PO), Take 2,000 mg by mouth 2 (two) times daily. , Disp: , Rfl:  ?  omeprazole (PRILOSEC) 20 MG capsule, Take 20 mg by mouth every morning., Disp: , Rfl:  ?  ondansetron (ZOFRAN ODT) 4 MG disintegrating tablet, Take 1 tablet (4 mg total) by mouth every 8 (eight) hours as needed for nausea or vomiting., Disp: 6 tablet, Rfl: 0 ?  polyethylene glycol (MIRALAX / GLYCOLAX) packet, Take 17 g by mouth daily. , Disp: , Rfl:  ?  predniSONE (STERAPRED UNI-PAK 21 TAB) 10 MG (21) TBPK tablet, 10mg  Tabs, 6 day taper. Use as directed, Disp: 1 each, Rfl: 0 ?  ranitidine (ZANTAC) 150 MG tablet, Take 150 mg by mouth 2 (two) times daily., Disp: , Rfl:   ? ?  Allergies  ?Allergen Reactions  ? Alogliptin   ? Lisinopril   ?  Other reaction(s): Angioedema  ? ? ?Past Medical History:  ?Diagnosis Date  ? Agent orange exposure   ? Coronary artery disease   ? Diabetes mellitus   ? Headache   ? Hyperlipidemia   ? Hypertension   ? Myocardial infarction [facility]) 2009  ? X3 -stent placed and pacemaker (2011)  ? PTSD (post-traumatic stress disorder)   ? Vertigo   ?  ? ?Past Surgical History:  ?Procedure Laterality Date  ? BACK SURGERY    ? CAROTID STENT    ? KNEE SURGERY    ? PACEMAKER INSERTION    ? ? ?History reviewed. No pertinent family history. ? ?Social History  ? ?Tobacco Use  ? Smoking status: Former  ?  Packs/day: 3.00  ?  Years: 40.00  ?  Pack years: 120.00  ?  Types: Cigarettes  ?  Quit date: 01/03/2007  ?  Years  since quitting: 14.1  ? Smokeless tobacco: Former  ?  Types: Chew, Snuff  ?  Quit date: 01/03/2011  ?Vaping Use  ? Vaping Use: Never used  ?Substance Use Topics  ? Alcohol use: No  ? Drug use: No  ? ? ?ROS ? ? ?Objective:  ? ?Vitals: ?BP 121/82 (BP Location: Right Arm)   Pulse 87   Temp 98.2 ?F (36.8 ?C) (Oral)   Resp 18   SpO2 95%  ? ?Physical Exam ?Constitutional:   ?   General: He is not in acute distress. ?   Appearance: Normal appearance. He is well-developed and normal weight. He is not ill-appearing, toxic-appearing or diaphoretic.  ?HENT:  ?   Head: Normocephalic and atraumatic.  ?   Right Ear: Tympanic membrane, ear canal and external ear normal. There is no impacted cerumen.  ?   Left Ear: Tympanic membrane, ear canal and external ear normal. There is no impacted cerumen.  ?   Nose: Nose normal. No congestion or rhinorrhea.  ?   Mouth/Throat:  ?   Mouth: Mucous membranes are moist.  ?   Pharynx: No oropharyngeal exudate or posterior oropharyngeal erythema.  ?Eyes:  ?   General: No scleral icterus.    ?   Right eye: No discharge.     ?   Left eye: No discharge.  ?   Extraocular Movements: Extraocular movements intact.  ?   Conjunctiva/sclera: Conjunctivae normal.  ?Cardiovascular:  ?   Rate and Rhythm: Normal rate and regular rhythm.  ?   Heart sounds: Normal heart sounds. No murmur heard. ?  No friction rub. No gallop.  ?Pulmonary:  ?   Effort: Pulmonary effort is normal. No respiratory distress.  ?   Breath sounds: Normal breath sounds. No stridor. No wheezing, rhonchi or rales.  ?Musculoskeletal:  ?   Cervical back: Normal range of motion and neck supple. No rigidity. No muscular tenderness.  ?Neurological:  ?   General: No focal deficit present.  ?   Mental Status: He is alert and oriented to person, place, and time.  ?Psychiatric:     ?   Mood and Affect: Mood normal.     ?   Behavior: Behavior normal.     ?   Thought Content: Thought content normal.  ? ? ? ? ?Assessment and Plan :  ? ?PDMP not  reviewed this encounter. ? ?1. Acute viral syndrome   ?2. Nasal congestion   ?3. Chills   ?4. Body aches   ?  5. Heart disease   ?6. Type 2 diabetes mellitus treated with insulin (HCC)   ?7. History of MI (myocardial infarction)   ? ?Deferred imaging given clear cardiopulmonary exam, hemodynamically stable vital signs. Will manage for viral illness such as viral URI, viral syndrome, viral rhinitis, COVID-19. Recommended supportive care. Offered scripts for symptomatic relief. Testing is pending. Counseled patient on potential for adverse effects with medications prescribed/recommended today, ER and return-to-clinic precautions discussed, patient verbalized understanding.  ? ?  ?Wallis Bamberg, PA-C ?03/10/21 1326 ? ?

## 2021-03-11 LAB — NOVEL CORONAVIRUS, NAA: SARS-CoV-2, NAA: NOT DETECTED

## 2021-05-25 ENCOUNTER — Ambulatory Visit: Payer: Self-pay | Admitting: Orthopedic Surgery

## 2021-05-25 NOTE — Progress Notes (Signed)
Called phone number to request preop orders for surgery. Left voicemail requesting.     05/25/21 0948  Preop Orders  Has preop orders? No  Name of staff/physician contacted for orders(Indicate phone or IB message) sherry wills

## 2021-05-31 NOTE — Patient Instructions (Signed)
DUE TO COVID-19 ONLY TWO VISITORS  (aged 75 and older)  ARE ALLOWED TO COME WITH YOU AND STAY IN THE WAITING ROOM ONLY DURING PRE OP AND PROCEDURE.   **NO VISITORS ARE ALLOWED IN THE SHORT STAY AREA OR RECOVERY ROOM!!**  IF YOU WILL BE ADMITTED INTO THE HOSPITAL YOU ARE ALLOWED ONLY FOUR SUPPORT PEOPLE DURING VISITATION HOURS ONLY (7 AM -8PM)   The support person(s) must pass our screening, gel in and out, and wear a mask at all times, including in the patient's room. Patients must also wear a mask when staff or their support person are in the room. Visitors GUEST BADGE MUST BE WORN VISIBLY  One adult visitor may remain with you overnight and MUST be in the room by 8 P.M.     Your procedure is scheduled on: 06/10/21   Report to [facility] Main Entrance    Report to admitting at   10:30 AM   Call this number if you have problems the morning of surgery [phone]   Do not eat food :After Midnight.   After Midnight you may have the following liquids until _10:0_ AM/  DAY OF SURGERY  Water Black Coffee (sugar ok, NO MILK/CREAM OR CREAMERS)  Tea (sugar ok, NO MILK/CREAM OR CREAMERS) regular and decaf                             Plain Jell-O (NO RED)                                           Fruit ices (not with fruit pulp, NO RED)                                     Popsicles (NO RED)                                                                  Juice: apple, WHITE grape, WHITE cranberry Sports drinks like Gatorade (NO RED) Clear broth(vegetable,chicken,beef)                    The day of surgery:  Drink ONE (1) G2 at 9:45 AM the morning of surgery. Drink in one sitting. Do not sip.  This drink was given to you during your hospital  pre-op appointment visit. Nothing else to drink after completing the   G2. At 10:00          If you have questions, please contact your surgeon's office.       Oral Hygiene is also important to reduce your risk of infection.                                     Remember - BRUSH YOUR TEETH THE MORNING OF SURGERY WITH YOUR REGULAR TOOTHPASTE   Do NOT smoke after Midnight   Take these medicines the morning of surgery with A SIP OF WATER: Metoprolol, Amlodipine  Before surgery.Stop taking _ASA 325 mg__________on __________as instructed by _____________.  Stop taking ____________as directed by your Surgeon/Cardiologist.  Contact your Surgeon/Cardiologist for instructions on Anticoagulant Therapy prior to surgery.    How to Manage Your Diabetes Before and After Surgery  Why is it important to control my blood sugar before and after surgery? Improving blood sugar levels before and after surgery helps healing and can limit problems. A way of improving blood sugar control is eating a healthy diet by:  Eating less sugar and carbohydrates  Increasing activity/exercise  Talking with your doctor about reaching your blood sugar goals High blood sugars (greater than 180 mg/dL) can raise your risk of infections and slow your recovery, so you will need to focus on controlling your diabetes during the weeks before surgery. Make sure that the doctor who takes care of your diabetes knows about your planned surgery including the date and location.  How do I manage my blood sugar before surgery? Check your blood sugar at least 4 times a day, starting 2 days before surgery, to make sure that the level is not too high or low. Check your blood sugar the morning of your surgery when you wake up and every 2 hours until you get to the Short Stay unit. If your blood sugar is less than 70 mg/dL, you will need to treat for low blood sugar: Do not take insulin. Treat a low blood sugar (less than 70 mg/dL) with  cup of clear juice (cranberry or apple), 4 glucose tablets, OR glucose gel. Recheck blood sugar in 15 minutes after treatment (to make sure it is greater than 70 mg/dL). If your blood sugar is not greater than 70 mg/dL on recheck, call  [phone] for further instructions. Report your blood sugar to the short stay nurse when you get to Short Stay.  If you are admitted to the hospital after surgery: Your blood sugar will be checked by the staff and you will probably be given insulin after surgery (instead of oral diabetes medicines) to make sure you have good blood sugar levels. The goal for blood sugar control after surgery is 80-180 mg/dL.   WHAT DO I DO ABOUT MY DIABETES MEDICATION?  Do not take your Jardiance the day before surgery  Do not take oral diabetes medicines (pills) the morning of surgery.  Bring CPAP mask and tubing day of surgery.                              You may not have any metal on your body including hair pins, jewelry, and body piercing             Do not wear make-up, lotions, powders, perfumes/cologne, or deodorant  Do not wear nail polish including gel and S&S, artificial/acrylic nails, or any other type of covering on natural nails including finger and toenails. If you have artificial nails, gel coating, etc. that needs to be removed by a nail salon please have this removed prior to surgery or surgery may need to be canceled/ delayed if the surgeon/ anesthesia feels like they are unable to be safely monitored.   Do not shave  48 hours prior to surgery.               Men may shave face and neck.   Do not bring valuables to the hospital. [facility] IS NOT  RESPONSIBLE   FOR VALUABLES.   Contacts, dentures or bridgework may not be worn into surgery.   Bring small overnight bag day of surgery.    Patients discharged on the day of surgery will not be allowed to drive home.  Someone NEEDS to stay with you for the first 24 hours after anesthesia.   Special Instructions: Bring a copy of your healthcare power of attorney and living will documents         the day of surgery if you haven't scanned them before.              Please read over the following fact sheets you were given:  IF YOU HAVE QUESTIONS ABOUT YOUR PRE-OP INSTRUCTIONS PLEASE CALL [phone]     [facility] - Preparing for Surgery Before surgery, you can play an important role.  Because skin is not sterile, your skin needs to be as free of germs as possible.  You can reduce the number of germs on your skin by washing with CHG (chlorahexidine gluconate) soap before surgery.  CHG is an antiseptic cleaner which kills germs and bonds with the skin to continue killing germs even after washing. Please DO NOT use if you have an allergy to CHG or antibacterial soaps.  If your skin becomes reddened/irritated stop using the CHG and inform your nurse when you arrive at Short Stay. Do not shave (including legs and underarms) for at least 48 hours prior to the first CHG shower.  You may shave your face/neck. Please follow these instructions carefully:  1.  Shower with CHG Soap the night before surgery and the  morning of Surgery.  2.  If you choose to wash your hair, wash your hair first as usual with your  normal  shampoo.  3.  After you shampoo, rinse your hair and body thoroughly to remove the  shampoo.                           4.  Use CHG as you would any other liquid soap.  You can apply chg directly  to the skin and wash                       Gently with a scrungie or clean washcloth.  5.  Apply the CHG Soap to your body ONLY FROM THE NECK DOWN.   Do not use on face/ open                           Wound or open sores. Avoid contact with eyes, ears mouth and genitals (private parts).                       Wash face,  Genitals (private parts) with your normal soap.             6.  Wash thoroughly, paying special attention to the area where your surgery  will be performed.  7.  Thoroughly rinse your body with warm water from the neck down.  8.  DO NOT shower/wash with your normal soap after using and rinsing off  the CHG Soap.                9.  Pat yourself dry with a clean towel.            10.  Wear clean  pajamas.  11.  Place clean sheets on your bed the night of your first shower and do not  sleep with pets. Day of Surgery : Do not apply any lotions/deodorants the morning of surgery.  Please wear clean clothes to the hospital/surgery center.  FAILURE TO FOLLOW THESE INSTRUCTIONS MAY RESULT IN THE CANCELLATION OF YOUR SURGERY PATIENT SIGNATURE_________________________________  NURSE SIGNATURE__________________________________  ________________________________________________________________________   Marcus Patterson  An incentive spirometer is a tool that can help keep your lungs clear and active. This tool measures how well you are filling your lungs with each breath. Taking long deep breaths may help reverse or decrease the chance of developing breathing (pulmonary) problems (especially infection) following: A long period of time when you are unable to move or be active. BEFORE THE PROCEDURE  If the spirometer includes an indicator to show your best effort, your nurse or respiratory therapist will set it to a desired goal. If possible, sit up straight or lean slightly forward. Try not to slouch. Hold the incentive spirometer in an upright position. INSTRUCTIONS FOR USE  Sit on the edge of your bed if possible, or sit up as far as you can in bed or on a chair. Hold the incentive spirometer in an upright position. Breathe out normally. Place the mouthpiece in your mouth and seal your lips tightly around it. Breathe in slowly and as deeply as possible, raising the piston or the ball toward the top of the column. Hold your breath for 3-5 seconds or for as long as possible. Allow the piston or ball to fall to the bottom of the column. Remove the mouthpiece from your mouth and breathe out normally. Rest for a few seconds and repeat Steps 1 through 7 at least 10 times every 1-2 hours when you are awake. Take your time and take a few normal breaths between deep breaths. The  spirometer may include an indicator to show your best effort. Use the indicator as a goal to work toward during each repetition. After each set of 10 deep breaths, practice coughing to be sure your lungs are clear. If you have an incision (the cut made at the time of surgery), support your incision when coughing by placing a pillow or rolled up towels firmly against it. Once you are able to get out of bed, walk around indoors and cough well. You may stop using the incentive spirometer when instructed by your caregiver.  RISKS AND COMPLICATIONS Take your time so you do not get dizzy or light-headed. If you are in pain, you may need to take or ask for pain medication before doing incentive spirometry. It is harder to take a deep breath if you are having pain. AFTER USE Rest and breathe slowly and easily. It can be helpful to keep track of a log of your progress. Your caregiver can provide you with a simple table to help with this. If you are using the spirometer at home, follow these instructions: [place] IF:  You are having difficultly using the spirometer. You have trouble using the spirometer as often as instructed. Your pain medication is not giving enough relief while using the spirometer. You develop fever of 100.5 F (38.1 C) or higher. SEEK IMMEDIATE MEDICAL CARE IF:  You cough up bloody sputum that had not been present before. You develop fever of 102 F (38.9 C) or greater. You develop worsening pain at or near the incision site. MAKE SURE YOU:  Understand these instructions. Will watch your condition. Will get  help right away if you are not doing well or get worse. Document Released: 05/01/2006 Document Revised: 03/13/2011 Document Reviewed: 07/02/2006 [facility] Patient Information 2014 [place], [place].   ________________________________________________________________________

## 2021-06-01 ENCOUNTER — Encounter (HOSPITAL_COMMUNITY)
Admission: RE | Admit: 2021-06-01 | Discharge: 2021-06-01 | Disposition: A | Discharge status: Home / Self Care | Payer: No Typology Code available for payment source | Source: Ambulatory Visit | Attending: Specialist | Admitting: Specialist

## 2021-06-01 ENCOUNTER — Other Ambulatory Visit: Payer: Self-pay

## 2021-06-01 ENCOUNTER — Encounter (HOSPITAL_COMMUNITY): Payer: Self-pay

## 2021-06-01 VITALS — BP 169/101 | HR 87 | Temp 98.2°F | Resp 18 | Ht 68.0 in | Wt 182.0 lb

## 2021-06-01 DIAGNOSIS — Z01812 Encounter for preprocedural laboratory examination: Secondary | ICD-10-CM | POA: Diagnosis not present

## 2021-06-01 DIAGNOSIS — E119 Type 2 diabetes mellitus without complications: Secondary | ICD-10-CM | POA: Insufficient documentation

## 2021-06-01 DIAGNOSIS — Z794 Long term (current) use of insulin: Secondary | ICD-10-CM | POA: Diagnosis not present

## 2021-06-01 DIAGNOSIS — Z01818 Encounter for other preprocedural examination: Secondary | ICD-10-CM

## 2021-06-01 LAB — BASIC METABOLIC PANEL
Anion gap: 7 (ref 5–15)
BUN: 18 mg/dL (ref 8–23)
CO2: 26 mmol/L (ref 22–32)
Calcium: 9.5 mg/dL (ref 8.9–10.3)
Chloride: 108 mmol/L (ref 98–111)
Creatinine, Ser: 0.75 mg/dL (ref 0.61–1.24)
GFR, Estimated: >60 mL/min (ref >60)
Glucose, Bld: 117 mg/dL — ABNORMAL HIGH (ref 70–99)
Potassium: 4.9 mmol/L (ref 3.5–5.1)
Sodium: 141 mmol/L (ref 135–145)

## 2021-06-01 LAB — CBC
HCT: 49.9 % (ref 39.0–52.0)
Hemoglobin: 16.6 g/dL (ref 13.0–17.0)
MCH: 31.3 pg (ref 26.0–34.0)
MCHC: 33.3 g/dL (ref 30.0–36.0)
MCV: 94.2 fL (ref 80.0–100.0)
Platelets: 159 10*3/uL (ref 150–400)
RBC: 5.3 MIL/uL (ref 4.22–5.81)
RDW: 13.7 % (ref 11.5–15.5)
WBC: 6.9 10*3/uL (ref 4.0–10.5)
nRBC: 0 % (ref 0.0–0.2)

## 2021-06-01 LAB — SURGICAL PCR SCREEN
MRSA, PCR: NEGATIVE
Staphylococcus aureus: NEGATIVE

## 2021-06-01 LAB — HEMOGLOBIN A1C
Hgb A1c MFr Bld: 6.6 % — ABNORMAL HIGH (ref 4.8–5.6)
Mean Plasma Glucose: 142.72 mg/dL

## 2021-06-01 LAB — GLUCOSE, CAPILLARY: Glucose-Capillary: 99 mg/dL (ref 70–99)

## 2021-06-01 NOTE — Progress Notes (Signed)
Anesthesia note:  Bowel prep reminder:NA  PCP - Kathryne [facility]per [facility] Cardiologist -Pt couldn't remember the name at the [facility]. Other-   Chest x-ray - 07/18/20-epic EKG - 07/19/20-epic Stress Test - no ECHO - 2015 Cardiac Cath -   Pacemaker/ICD device last checked: Pt can't remember. Requested from the [facility]  Sleep Study - yes CPAP - no Pt can't tolerate the mask   Fasting Blood Sugar - 70-140 Checks Blood Sugar ___once a month__  Blood Thinner:ASA 325 mg/ Dr. At [facility]. Blood Thinner Instructions:none Aspirin Instructions:Pt and daughter will call Last Dose:06/02/21  Anesthesia review: yes  Patient denies shortness of breath, fever, cough and chest pain at PAT appointment Pt reports no SOB but he does have chronic fatigue syndrome.His BP was high at the PAT visit 169/101 he said he didn't take his meds yet today. I told him to monitor his BP and call PCP if still high.  Patient verbalized understanding of instructions that were given to them at the PAT appointment. Patient was also instructed that they will need to review over the PAT instructions again at home before surgery. Yes. Daughter was with him

## 2021-06-03 ENCOUNTER — Encounter (HOSPITAL_COMMUNITY): Payer: Self-pay

## 2021-06-03 NOTE — Progress Notes (Signed)
Anesthesia Chart Review   Case: [id number] Date/Time: 06/10/21 1300   Procedure: TOTAL KNEE ARTHROPLASTY (Right: Knee)   Anesthesia type: Spinal   Pre-op diagnosis: DGENERATIVE JOINT DISEASE RIGHT KNEE   Location: [facility] ROOM 07 / [facility]   Surgeons: Jene Every, MD       DISCUSSION:76 y.o. former smoker with h/o HTN, CAD s/p DES 2004, sinus node dysfunction s/p PPM, DM II, right knee djd scheduled for above procedure 06/10/2021 with Dr. Jene Every.   Pt seen by cardiology with the [facility] 02/11/2021. Per OV note pt stable at this visit.  Cardiology cleared pt to hold ASA 6 days prior to surgery.   Low risk stress test 01/07/2020.   Device orders requested.  VS: BP (!) 169/101   Pulse 87   Temp 36.8 C (Oral)   Resp 18   Ht 5\' 8"  (1.727 m)   Wt 82.6 kg   SpO2 98%   BMI 27.67 kg/m   PROVIDERS: Clinic,   LABS: Labs reviewed: Acceptable for surgery. 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 The left ventricular end-diastolic volume is 63. The overall calculated left ventricular ejection fraction is 67%. No left ventricular regional wall motion abnormalities Low cardiovascular risk study.   Echo 01/05/2020 There is mild concentric left ventricular hypertrophy The left ventricle is normal  in size and systolic function: Ejection Fraction = 55-60% The right ventricle is normal in size and function There is a pacemaker lead in the right ventricle There is mild tricuspid regurgitation The inferior vena cava was not visualized during the exam.   Echo 03/04/2013 - Left ventricle: The cavity size was normal. Wall thickness    was increased in a pattern of mild LVH. Systolic function    was normal. The estimated ejection fraction was in the    range of 55% to 60%. Wall motion was normal; there were no    regional wall motion abnormalities. Doppler parameters are    consistent with abnormal left ventricular relaxation    (grade 1 diastolic dysfunction).  - Aortic valve: Trileaflet; mildly calcified leaflets. No    significant regurgitation.  - Mitral valve: Trivial regurgitation.  - Right ventricle: Pacer wire or catheter noted in right    ventricle.  - Tricuspid valve: Trivial regurgitation. Peak RV-RA    gradient: 36mm Hg (S).  - Inferior vena cava: Poorly visualized. Unable to estimate    CVP.  - Pericardium, extracardiac: There was no pericardial    effusion.  Past Medical History:  Diagnosis Date   Agent orange exposure    Coronary artery disease    Diabetes mellitus    Headache    Hyperlipidemia    Hypertension    Myocardial infarction (HCC) 01/03/2007   X3 -stent placed and pacemaker (  2011)   PTSD (post-traumatic stress disorder)    Substance abuse (HCC) 2006   Vertigo     Past Surgical History:  Procedure Laterality Date   BACK SURGERY  2003   CAROTID STENT  2009   KNEE SURGERY Right 2012   PACEMAKER IMPLANT  2022   PACEMAKER INSERTION  2013    MEDICATIONS:  amLODipine (NORVASC) 2.5 MG tablet   aspirin 325 MG tablet   atorvastatin (LIPITOR) 80 MG tablet   benzonatate (TESSALON) 100 MG capsule   cholecalciferol (VITAMIN D) 1000 units tablet   empagliflozin (JARDIANCE) 25 MG TABS tablet   EPINEPHrine 0.3 mg/0.3 mL IJ SOAJ injection   hydrOXYzine  (ATARAX/VISTARIL) 25 MG tablet   ipratropium (ATROVENT) 0.03 % nasal spray   levocetirizine (XYZAL) 5 MG tablet   meclizine (ANTIVERT) 25 MG tablet   metFORMIN (GLUCOPHAGE-XR) 500 MG 24 hr tablet   metoprolol succinate (TOPROL-XL) 50 MG 24 hr tablet   nitroGLYCERIN (NITROSTAT) 0.4 MG SL tablet   Omega-3 Fatty Acids (FISH OIL) 1000 MG CAPS   ondansetron (ZOFRAN ODT) 4 MG disintegrating tablet   polyethylene glycol (MIRALAX / GLYCOLAX) packet   predniSONE (STERAPRED UNI-PAK 21 TAB) 10 MG (21) TBPK tablet   promethazine-dextromethorphan (PROMETHAZINE-DM) 6.25-15 MG/5ML syrup   No current facility-administered medications for this encounter.    Jodell Cipro Ward, PA-C [facility] Pre-Surgical Testing [phone]

## 2021-06-05 ENCOUNTER — Ambulatory Visit: Payer: Self-pay | Admitting: Orthopedic Surgery

## 2021-06-05 NOTE — H&P (Signed)
Marcus Patterson is an 76 y.o. male.   Chief Complaint: right knee pain HPI: Patient is here for his H&P. He is scheduled for a right total knee replacement by Dr. Beane at [facility] on 06/10/21.  He has failed conservative tx including injections, medications, activity modifications, quad strengthening, home exercise program, bracing, relative rest.  he has been cleared by his PCP for surgery and to d/c aspirin for surgery.  Dr. Beane and the patient mutually agreed to proceed with a total knee replacement. Risks and benefits of the procedure were discussed including stiffness, suboptimal range of motion, persistent pain, infection requiring removal of prosthesis and reinsertion, need for prophylactic antibiotics in the future, for example, dental procedures, possible need for manipulation, revision in the future and also anesthetic complications including DVT, PE, etc. We discussed the perioperative course, time in the hospital, postoperative recovery and the need for elevation to control swelling. We also discussed the predicted range of motion and the probability that squatting and kneeling would be unobtainable in the future. In addition, postoperative anticoagulation was discussed. We have obtained preoperative medical clearance as necessary. Provided illustrated handout and discussed it in detail. They will enroll in the total joint replacement educational forum at the hospital.  Past Medical History:  Diagnosis Date   Agent orange exposure    Coronary artery disease    Diabetes mellitus    Headache    Hyperlipidemia    Hypertension    Myocardial infarction (HCC) 01/03/2007   X3 -stent placed and pacemaker (2011)   PTSD (post-traumatic stress disorder)    Substance abuse (HCC) 2006   Vertigo     Past Surgical History:  Procedure Laterality Date   BACK SURGERY  2003   CAROTID STENT  2009   KNEE SURGERY Right 2012   PACEMAKER IMPLANT  2022   PACEMAKER INSERTION  2013     No family history on file. Social History:  reports that he quit smoking about 14 years ago. His smoking use included cigarettes. He has a 120.00 pack-year smoking history. He quit smokeless tobacco use about 10 years ago.  His smokeless tobacco use included chew and snuff. He reports that he does not drink alcohol and does not use drugs.  Allergies:  Allergies  Allergen Reactions   Alogliptin    Lisinopril     Other reaction(s): Angioedema   Current meds: aspirin 325 mg tablet benzonatate 100 mg capsule Fish OiL ipratropium bromide 21 mcg (0.03 %) nasal spray levocetirizine 5 mg tablet  Review of Systems  Constitutional: Negative.   HENT: Negative.    Eyes: Negative.   Respiratory: Negative.    Cardiovascular: Negative.   Gastrointestinal: Negative.   Endocrine: Negative.   Genitourinary: Negative.   Musculoskeletal:  Positive for arthralgias, gait problem, joint swelling and myalgias.  Skin: Negative.   Allergic/Immunologic: Negative.   Hematological: Negative.   Psychiatric/Behavioral: Negative.     There were no vitals taken for this visit. Physical Exam Constitutional:      Appearance: Normal appearance.  HENT:     Head: Normocephalic and atraumatic.     Right Ear: External ear normal.     Left Ear: External ear normal.     Nose: Nose normal.     Mouth/Throat:     Pharynx: Oropharynx is clear.  Eyes:     Conjunctiva/sclera: Conjunctivae normal.  Cardiovascular:     Rate and Rhythm: Normal rate and regular rhythm.     Pulses: Normal pulses.    Pulmonary:     Effort: Pulmonary effort is normal.  Abdominal:     General: Abdomen is flat. Bowel sounds are normal.  Musculoskeletal:     Cervical back: Normal range of motion.     Comments: Right knee is exquisitely tender lateral joint line patellofemoral pain compression. Ranges -5-1 10. Nontender over the fibular head. Slight valgus deformity. Ipsilateral hip and ankle exam is unremarkable.  Skin:     General: Skin is warm and dry.  Neurological:     Mental Status: He is alert.     Assessment/Plan Impression: Right knee end-stage osteoarthritis  Pt with end-stage right knee DJD, bone-on-bone, refractory to conservative tx, scheduled for right total knee replacement by Dr. Beane on 06/10/21. We again discussed the procedure itself as well as risks, complications and alternatives, including but not limited to DVT, PE, infx, bleeding, failure of procedure, need for secondary procedure including manipulation, nerve injury, ongoing pain/symptoms, anesthesia risk, even stroke or death. Also discussed typical post-op protocols, activity restrictions, need for PT, flexion/extension exercises, time out of work. Discussed need for DVT ppx post-op per protocol. Discussed dental ppx and infx prevention. Also discussed limitations post-operatively such as kneeling and squatting. All questions were answered. Patient desires to proceed with surgery as scheduled.  Will hold supplements, ASA and NSAIDs accordingly. Will remain NPO after midnight the night before surgery. [facility] present to [facility] for pre-op testing. Anticipate hospital stay to include at least 2 midnights given medical history and to ensure proper pain control. Plan ASA for DVT ppx post-op. Plan oxycodone, Colace, Miralax. Plan home with HHPT post-op with family members at home for assistance. Will follow up 10-14 days post-op for staple removal and xrays  Plan right total knee replacement  Marcus Patterson M Marcus Genter, PA-C for Dr Beane 06/05/2021, 9:53 PM    

## 2021-06-05 NOTE — H&P (View-Only) (Signed)
IAM SENDER is an 76 y.o. male.   Chief Complaint: right knee pain HPI: Patient is here for his H&P. He is scheduled for a right total knee replacement by Dr. Tonita Cong at [facility] on 06/10/21.  He has failed conservative tx including injections, medications, activity modifications, quad strengthening, home exercise program, bracing, relative rest.  he has been cleared by his PCP for surgery and to d/c aspirin for surgery.  Dr. Tonita Cong and the patient mutually agreed to proceed with a total knee replacement. Risks and benefits of the procedure were discussed including stiffness, suboptimal range of motion, persistent pain, infection requiring removal of prosthesis and reinsertion, need for prophylactic antibiotics in the future, for example, dental procedures, possible need for manipulation, revision in the future and also anesthetic complications including DVT, PE, etc. We discussed the perioperative course, time in the hospital, postoperative recovery and the need for elevation to control swelling. We also discussed the predicted range of motion and the probability that squatting and kneeling would be unobtainable in the future. In addition, postoperative anticoagulation was discussed. We have obtained preoperative medical clearance as necessary. Provided illustrated handout and discussed it in detail. They will enroll in the total joint replacement educational forum at the hospital.  Past Medical History:  Diagnosis Date   Agent orange exposure    Coronary artery disease    Diabetes mellitus    Headache    Hyperlipidemia    Hypertension    Myocardial infarction [facility]) 01/03/2007   X3 -stent placed and pacemaker (2011)   PTSD (post-traumatic stress disorder)    Substance abuse ([place]) 2006   Vertigo     Past Surgical History:  Procedure Laterality Date   BACK SURGERY  2003   CAROTID STENT  2009   KNEE SURGERY Right 2012   PACEMAKER IMPLANT  2022   PACEMAKER INSERTION  2013     No family history on file. Social History:  reports that he quit smoking about 14 years ago. His smoking use included cigarettes. He has a 120.00 pack-year smoking history. He quit smokeless tobacco use about 10 years ago.  His smokeless tobacco use included chew and snuff. He reports that he does not drink alcohol and does not use drugs.  Allergies:  Allergies  Allergen Reactions   Alogliptin    Lisinopril     Other reaction(s): Angioedema   Current meds: aspirin 325 mg tablet benzonatate 100 mg capsule Fish OiL ipratropium bromide 21 mcg (0.03 %) nasal spray levocetirizine 5 mg tablet  Review of Systems  Constitutional: Negative.   HENT: Negative.    Eyes: Negative.   Respiratory: Negative.    Cardiovascular: Negative.   Gastrointestinal: Negative.   Endocrine: Negative.   Genitourinary: Negative.   Musculoskeletal:  Positive for arthralgias, gait problem, joint swelling and myalgias.  Skin: Negative.   Allergic/Immunologic: Negative.   Hematological: Negative.   Psychiatric/Behavioral: Negative.     There were no vitals taken for this visit. Physical Exam Constitutional:      Appearance: Normal appearance.  HENT:     Head: Normocephalic and atraumatic.     Right Ear: External ear normal.     Left Ear: External ear normal.     Nose: Nose normal.     Mouth/Throat:     Pharynx: Oropharynx is clear.  Eyes:     Conjunctiva/sclera: Conjunctivae normal.  Cardiovascular:     Rate and Rhythm: Normal rate and regular rhythm.     Pulses: Normal pulses.  Pulmonary:     Effort: Pulmonary effort is normal.  Abdominal:     General: Abdomen is flat. Bowel sounds are normal.  Musculoskeletal:     Cervical back: Normal range of motion.     Comments: Right knee is exquisitely tender lateral joint line patellofemoral pain compression. Ranges -5-1 10. Nontender over the fibular head. Slight valgus deformity. Ipsilateral hip and ankle exam is unremarkable.  Skin:     General: Skin is warm and dry.  Neurological:     Mental Status: He is alert.     Assessment/Plan Impression: Right knee end-stage osteoarthritis  Pt with end-stage right knee DJD, bone-on-bone, refractory to conservative tx, scheduled for right total knee replacement by Dr. Tonita Cong on 06/10/21. We again discussed the procedure itself as well as risks, complications and alternatives, including but not limited to DVT, PE, infx, bleeding, failure of procedure, need for secondary procedure including manipulation, nerve injury, ongoing pain/symptoms, anesthesia risk, even stroke or death. Also discussed typical post-op protocols, activity restrictions, need for PT, flexion/extension exercises, time out of work. Discussed need for DVT ppx post-op per protocol. Discussed dental ppx and infx prevention. Also discussed limitations post-operatively such as kneeling and squatting. All questions were answered. Patient desires to proceed with surgery as scheduled.  Will hold supplements, ASA and NSAIDs accordingly. Will remain NPO after midnight the night before surgery. Will present to [facility] for pre-op testing. Anticipate hospital stay to include at least 2 midnights given medical history and to ensure proper pain control. Plan ASA for DVT ppx post-op. Plan oxycodone, Colace, Miralax. Plan home with HHPT post-op with family members at home for assistance. Will follow up 10-14 days post-op for staple removal and xrays  Plan right total knee replacement  Cecilie Kicks, PA-C for Dr Tonita Cong 06/05/2021, 9:53 PM

## 2021-06-09 ENCOUNTER — Ambulatory Visit: Payer: Self-pay | Admitting: Orthopedic Surgery

## 2021-06-10 ENCOUNTER — Ambulatory Visit (HOSPITAL_COMMUNITY): Payer: No Typology Code available for payment source | Admitting: Physician Assistant

## 2021-06-10 ENCOUNTER — Ambulatory Visit (HOSPITAL_BASED_OUTPATIENT_CLINIC_OR_DEPARTMENT_OTHER): Payer: No Typology Code available for payment source | Admitting: Certified Registered"

## 2021-06-10 ENCOUNTER — Ambulatory Visit (HOSPITAL_COMMUNITY)
Admission: RE | Admit: 2021-06-10 | Discharge: 2021-06-12 | Disposition: A | Discharge status: Home / Self Care | Payer: No Typology Code available for payment source | Source: Ambulatory Visit | Attending: Specialist | Admitting: Specialist

## 2021-06-10 ENCOUNTER — Other Ambulatory Visit: Payer: Self-pay

## 2021-06-10 ENCOUNTER — Encounter (HOSPITAL_COMMUNITY): Payer: Self-pay | Admitting: Specialist

## 2021-06-10 ENCOUNTER — Encounter (HOSPITAL_COMMUNITY)
Admission: RE | Disposition: A | Discharge status: Home / Self Care | Payer: Self-pay | Source: Ambulatory Visit | Attending: Specialist

## 2021-06-10 ENCOUNTER — Ambulatory Visit (HOSPITAL_COMMUNITY): Payer: No Typology Code available for payment source

## 2021-06-10 DIAGNOSIS — I252 Old myocardial infarction: Secondary | ICD-10-CM | POA: Insufficient documentation

## 2021-06-10 DIAGNOSIS — Z87891 Personal history of nicotine dependence: Secondary | ICD-10-CM | POA: Insufficient documentation

## 2021-06-10 DIAGNOSIS — M21161 Varus deformity, not elsewhere classified, right knee: Secondary | ICD-10-CM | POA: Insufficient documentation

## 2021-06-10 DIAGNOSIS — M1711 Unilateral primary osteoarthritis, right knee: Secondary | ICD-10-CM | POA: Diagnosis not present

## 2021-06-10 DIAGNOSIS — I10 Essential (primary) hypertension: Secondary | ICD-10-CM

## 2021-06-10 DIAGNOSIS — E119 Type 2 diabetes mellitus without complications: Secondary | ICD-10-CM

## 2021-06-10 DIAGNOSIS — I2510 Atherosclerotic heart disease of native coronary artery without angina pectoris: Secondary | ICD-10-CM | POA: Diagnosis not present

## 2021-06-10 DIAGNOSIS — Z950 Presence of cardiac pacemaker: Secondary | ICD-10-CM | POA: Insufficient documentation

## 2021-06-10 DIAGNOSIS — Z794 Long term (current) use of insulin: Secondary | ICD-10-CM

## 2021-06-10 DIAGNOSIS — M25561 Pain in right knee: Secondary | ICD-10-CM | POA: Diagnosis present

## 2021-06-10 HISTORY — PX: TOTAL KNEE ARTHROPLASTY: SHX125

## 2021-06-10 LAB — GLUCOSE, CAPILLARY
Glucose-Capillary: 104 mg/dL — ABNORMAL HIGH (ref 70–99)
Glucose-Capillary: 116 mg/dL — ABNORMAL HIGH (ref 70–99)

## 2021-06-10 SURGERY — ARTHROPLASTY, KNEE, TOTAL
Anesthesia: Monitor Anesthesia Care | Site: Knee | Laterality: Right

## 2021-06-10 MED ORDER — LACTATED RINGERS IV SOLN: INTRAVENOUS | Status: DC

## 2021-06-10 MED ORDER — POLYETHYLENE GLYCOL 3350 17 G PO PACK: 17.0000 g | PACK | Freq: Every day | ORAL | 0 refills | Status: AC

## 2021-06-10 MED ORDER — TRANEXAMIC ACID-NACL 1000-0.7 MG/100ML-% IV SOLN
1000.0000 mg | INTRAVENOUS | Status: AC
  Administered 2021-06-10: 1000 mg via INTRAVENOUS
  Filled 2021-06-10: qty 100

## 2021-06-10 MED ORDER — SODIUM CHLORIDE 0.9 % IR SOLN
Status: DC | PRN
  Administered 2021-06-10: 1000 mL

## 2021-06-10 MED ORDER — EPINEPHRINE 0.3 MG/0.3ML IJ SOAJ
0.3000 mg | INTRAMUSCULAR | Status: DC | PRN
Start: 2021-06-10 — End: 2021-06-12
  Filled 2021-06-10: qty 0.6

## 2021-06-10 MED ORDER — 0.9 % SODIUM CHLORIDE (POUR BTL) OPTIME
TOPICAL | Status: DC | PRN
  Administered 2021-06-10: 1000 mL

## 2021-06-10 MED ORDER — BUPIVACAINE LIPOSOME 1.3 % IJ SUSP
INTRAMUSCULAR | Status: AC
  Filled 2021-06-10: qty 20

## 2021-06-10 MED ORDER — DEXAMETHASONE SODIUM PHOSPHATE 10 MG/ML IJ SOLN
INTRAMUSCULAR | Status: DC | PRN
  Administered 2021-06-10: 4 mg via INTRAVENOUS

## 2021-06-10 MED ORDER — FENTANYL CITRATE PF 50 MCG/ML IJ SOSY
PREFILLED_SYRINGE | INTRAMUSCULAR | Status: AC
  Administered 2021-06-10: 50 ug via INTRAVENOUS
  Filled 2021-06-10: qty 2

## 2021-06-10 MED ORDER — PROPOFOL 10 MG/ML IV BOLUS
INTRAVENOUS | Status: DC | PRN
  Administered 2021-06-10: 20 mg via INTRAVENOUS
  Administered 2021-06-10: 10 mg via INTRAVENOUS
  Administered 2021-06-10: 20 mg via INTRAVENOUS

## 2021-06-10 MED ORDER — HYDROXYZINE HCL 25 MG PO TABS: 25.0000 mg | ORAL_TABLET | Freq: Four times a day (QID) | ORAL | Status: DC | PRN

## 2021-06-10 MED ORDER — DEXAMETHASONE SODIUM PHOSPHATE 10 MG/ML IJ SOLN
INTRAMUSCULAR | Status: AC
  Filled 2021-06-10: qty 1

## 2021-06-10 MED ORDER — NITROGLYCERIN 0.4 MG SL SUBL
0.4000 mg | SUBLINGUAL_TABLET | SUBLINGUAL | Status: DC | PRN
Start: 2021-06-10 — End: 2021-06-12

## 2021-06-10 MED ORDER — ACETAMINOPHEN 500 MG PO TABS
1000.0000 mg | ORAL_TABLET | Freq: Four times a day (QID) | ORAL | Status: AC
  Administered 2021-06-10 – 2021-06-11 (×3): 1000 mg via ORAL
  Filled 2021-06-10 (×3): qty 2

## 2021-06-10 MED ORDER — METOCLOPRAMIDE HCL 5 MG PO TABS: 5.0000 mg | ORAL_TABLET | Freq: Three times a day (TID) | ORAL | Status: DC | PRN

## 2021-06-10 MED ORDER — FENTANYL CITRATE PF 50 MCG/ML IJ SOSY: 50.0000 ug | PREFILLED_SYRINGE | Freq: Once | INTRAMUSCULAR | Status: AC

## 2021-06-10 MED ORDER — METHOCARBAMOL 500 MG IVPB - SIMPLE MED: 500.0000 mg | Freq: Four times a day (QID) | INTRAVENOUS | Status: DC | PRN

## 2021-06-10 MED ORDER — AMLODIPINE BESYLATE 5 MG PO TABS
2.5000 mg | ORAL_TABLET | Freq: Every day | ORAL | Status: DC
Start: 2021-06-11 — End: 2021-06-12
  Administered 2021-06-11 – 2021-06-12 (×2): 2.5 mg via ORAL
  Filled 2021-06-10 (×2): qty 1

## 2021-06-10 MED ORDER — ACETAMINOPHEN 10 MG/ML IV SOLN: 1000.0000 mg | Freq: Once | INTRAVENOUS | Status: DC | PRN

## 2021-06-10 MED ORDER — MIDAZOLAM HCL 2 MG/2ML IJ SOLN: 2.0000 mg | Freq: Once | INTRAMUSCULAR | Status: AC

## 2021-06-10 MED ORDER — METOPROLOL SUCCINATE ER 25 MG PO TB24
25.0000 mg | ORAL_TABLET | Freq: Every day | ORAL | Status: DC
Start: 2021-06-11 — End: 2021-06-12
  Administered 2021-06-11 – 2021-06-12 (×2): 25 mg via ORAL
  Filled 2021-06-10 (×2): qty 1

## 2021-06-10 MED ORDER — INSULIN ASPART 100 UNIT/ML IJ SOLN
0.0000 [IU] | Freq: Three times a day (TID) | INTRAMUSCULAR | Status: DC
  Administered 2021-06-11 (×2): 3 [IU] via SUBCUTANEOUS
  Administered 2021-06-11: 2 [IU] via SUBCUTANEOUS
  Administered 2021-06-12: 3 [IU] via SUBCUTANEOUS
  Administered 2021-06-12: 2 [IU] via SUBCUTANEOUS

## 2021-06-10 MED ORDER — ASPIRIN 81 MG PO CHEW
81.0000 mg | CHEWABLE_TABLET | Freq: Two times a day (BID) | ORAL | Status: DC
  Administered 2021-06-11 – 2021-06-12 (×3): 81 mg via ORAL
  Filled 2021-06-10 (×3): qty 1

## 2021-06-10 MED ORDER — METOCLOPRAMIDE HCL 5 MG/ML IJ SOLN
5.0000 mg | Freq: Three times a day (TID) | INTRAMUSCULAR | Status: DC | PRN
  Administered 2021-06-11 – 2021-06-12 (×2): 10 mg via INTRAVENOUS
  Filled 2021-06-10 (×2): qty 2

## 2021-06-10 MED ORDER — HYDROMORPHONE HCL 1 MG/ML IJ SOLN
0.5000 mg | INTRAMUSCULAR | Status: DC | PRN
  Administered 2021-06-11: 0.5 mg via INTRAVENOUS
  Filled 2021-06-10: qty 1

## 2021-06-10 MED ORDER — ROPIVACAINE HCL 7.5 MG/ML IJ SOLN
INTRAMUSCULAR | Status: DC | PRN
  Administered 2021-06-10: 20 mL via PERINEURAL

## 2021-06-10 MED ORDER — CEFAZOLIN SODIUM-DEXTROSE 2-4 GM/100ML-% IV SOLN
2.0000 g | INTRAVENOUS | Status: AC
  Administered 2021-06-10: 2 g via INTRAVENOUS
  Filled 2021-06-10: qty 100

## 2021-06-10 MED ORDER — LIDOCAINE 2% (20 MG/ML) 5 ML SYRINGE
INTRAMUSCULAR | Status: DC | PRN
  Administered 2021-06-10: 40 mg via INTRAVENOUS

## 2021-06-10 MED ORDER — FENTANYL CITRATE PF 50 MCG/ML IJ SOSY: 25.0000 ug | PREFILLED_SYRINGE | INTRAMUSCULAR | Status: DC | PRN

## 2021-06-10 MED ORDER — LEVOCETIRIZINE DIHYDROCHLORIDE 5 MG PO TABS
5.0000 mg | ORAL_TABLET | Freq: Every evening | ORAL | Status: DC
Start: 2021-06-10 — End: 2021-06-10

## 2021-06-10 MED ORDER — MECLIZINE HCL 25 MG PO TABS
25.0000 mg | ORAL_TABLET | Freq: Three times a day (TID) | ORAL | Status: DC | PRN
Start: 2021-06-10 — End: 2021-06-10

## 2021-06-10 MED ORDER — BUPIVACAINE-EPINEPHRINE (PF) 0.25% -1:200000 IJ SOLN
INTRAMUSCULAR | Status: AC
  Filled 2021-06-10: qty 30

## 2021-06-10 MED ORDER — ONDANSETRON 4 MG PO TBDP: 4.0000 mg | ORAL_TABLET | Freq: Three times a day (TID) | ORAL | Status: DC | PRN

## 2021-06-10 MED ORDER — BENZONATATE 100 MG PO CAPS: 100.0000 mg | ORAL_CAPSULE | Freq: Three times a day (TID) | ORAL | Status: DC | PRN

## 2021-06-10 MED ORDER — OXYCODONE HCL 5 MG PO TABS: 5.0000 mg | ORAL_TABLET | ORAL | 0 refills | Status: DC | PRN

## 2021-06-10 MED ORDER — EMPAGLIFLOZIN 25 MG PO TABS: 25.0000 mg | ORAL_TABLET | Freq: Every day | ORAL | Status: DC

## 2021-06-10 MED ORDER — EMPAGLIFLOZIN 10 MG PO TABS
10.0000 mg | ORAL_TABLET | Freq: Every day | ORAL | Status: DC
Start: 2021-06-11 — End: 2021-06-12
  Administered 2021-06-11 – 2021-06-12 (×2): 10 mg via ORAL
  Filled 2021-06-10 (×2): qty 1

## 2021-06-10 MED ORDER — DIPHENHYDRAMINE HCL 12.5 MG/5ML PO ELIX
12.5000 mg | ORAL_SOLUTION | ORAL | Status: DC | PRN
  Administered 2021-06-11 – 2021-06-12 (×2): 25 mg via ORAL
  Filled 2021-06-10 (×2): qty 10

## 2021-06-10 MED ORDER — CHLORHEXIDINE GLUCONATE 0.12 % MT SOLN
15.0000 mL | Freq: Once | OROMUCOSAL | Status: AC
  Administered 2021-06-10: 15 mL via OROMUCOSAL

## 2021-06-10 MED ORDER — ALBUTEROL SULFATE HFA 108 (90 BASE) MCG/ACT IN AERS
INHALATION_SPRAY | RESPIRATORY_TRACT | Status: AC
Start: 2021-06-10 — End: ?
  Filled 2021-06-10: qty 6.7

## 2021-06-10 MED ORDER — SODIUM CHLORIDE 0.9 % IV SOLN
INTRAVENOUS | Status: DC | PRN
  Administered 2021-06-10: 90 mL

## 2021-06-10 MED ORDER — ASCORBIC ACID 500 MG PO TABS
1000.0000 mg | ORAL_TABLET | Freq: Every day | ORAL | Status: DC
  Administered 2021-06-10 – 2021-06-12 (×3): 1000 mg via ORAL
  Filled 2021-06-10 (×3): qty 2

## 2021-06-10 MED ORDER — IPRATROPIUM BROMIDE 0.03 % NA SOLN: 2.0000 | Freq: Two times a day (BID) | NASAL | Status: DC

## 2021-06-10 MED ORDER — ACETAMINOPHEN 325 MG PO TABS: 325.0000 mg | ORAL_TABLET | Freq: Four times a day (QID) | ORAL | Status: DC | PRN

## 2021-06-10 MED ORDER — PHENYLEPHRINE HCL-NACL 20-0.9 MG/250ML-% IV SOLN: INTRAVENOUS | Status: DC | PRN

## 2021-06-10 MED ORDER — PROPOFOL 500 MG/50ML IV EMUL
INTRAVENOUS | Status: DC | PRN
  Administered 2021-06-10: 40 ug/kg/min via INTRAVENOUS

## 2021-06-10 MED ORDER — ASPIRIN 81 MG PO TBEC: 81.0000 mg | DELAYED_RELEASE_TABLET | Freq: Two times a day (BID) | ORAL | 1 refills | Status: AC

## 2021-06-10 MED ORDER — BISACODYL 5 MG PO TBEC: 5.0000 mg | DELAYED_RELEASE_TABLET | Freq: Every day | ORAL | Status: DC | PRN

## 2021-06-10 MED ORDER — METHOCARBAMOL 500 MG PO TABS
500.0000 mg | ORAL_TABLET | Freq: Four times a day (QID) | ORAL | Status: DC | PRN
  Administered 2021-06-11 – 2021-06-12 (×3): 500 mg via ORAL
  Filled 2021-06-10 (×4): qty 1

## 2021-06-10 MED ORDER — MENTHOL 3 MG MT LOZG
1.0000 | LOZENGE | OROMUCOSAL | Status: DC | PRN
Start: 2021-06-10 — End: 2021-06-12

## 2021-06-10 MED ORDER — ONDANSETRON HCL 4 MG/2ML IJ SOLN
INTRAMUSCULAR | Status: DC | PRN
  Administered 2021-06-10: 4 mg via INTRAVENOUS

## 2021-06-10 MED ORDER — ONDANSETRON HCL 4 MG PO TABS: 4.0000 mg | ORAL_TABLET | Freq: Four times a day (QID) | ORAL | Status: DC | PRN

## 2021-06-10 MED ORDER — STERILE WATER FOR IRRIGATION IR SOLN
Status: DC | PRN
  Administered 2021-06-10: 2000 mL

## 2021-06-10 MED ORDER — PHENYLEPHRINE 80 MCG/ML (10ML) SYRINGE FOR IV PUSH (FOR BLOOD PRESSURE SUPPORT)
PREFILLED_SYRINGE | INTRAVENOUS | Status: DC | PRN
  Administered 2021-06-10: 100 ug via INTRAVENOUS

## 2021-06-10 MED ORDER — OXYCODONE HCL 5 MG PO TABS
5.0000 mg | ORAL_TABLET | ORAL | Status: DC | PRN
  Administered 2021-06-10 – 2021-06-11 (×3): 5 mg via ORAL
  Filled 2021-06-10: qty 1
  Filled 2021-06-10: qty 2
  Filled 2021-06-10 (×2): qty 1

## 2021-06-10 MED ORDER — BUPIVACAINE IN DEXTROSE 0.75-8.25 % IT SOLN
INTRATHECAL | Status: DC | PRN
  Administered 2021-06-10: 1.8 mL via INTRATHECAL

## 2021-06-10 MED ORDER — SODIUM CHLORIDE (PF) 0.9 % IJ SOLN
INTRAMUSCULAR | Status: AC
  Filled 2021-06-10: qty 50

## 2021-06-10 MED ORDER — PROPOFOL 1000 MG/100ML IV EMUL
INTRAVENOUS | Status: AC
  Filled 2021-06-10: qty 100

## 2021-06-10 MED ORDER — POLYETHYLENE GLYCOL 3350 17 G PO PACK
17.0000 g | PACK | Freq: Every day | ORAL | Status: DC
  Administered 2021-06-10 – 2021-06-12 (×3): 17 g via ORAL
  Filled 2021-06-10 (×3): qty 1

## 2021-06-10 MED ORDER — PHENOL 1.4 % MT LIQD: 1.0000 | OROMUCOSAL | Status: DC | PRN

## 2021-06-10 MED ORDER — OXYCODONE HCL 5 MG PO TABS
10.0000 mg | ORAL_TABLET | ORAL | Status: DC | PRN
  Administered 2021-06-11: 10 mg via ORAL

## 2021-06-10 MED ORDER — ONDANSETRON HCL 4 MG/2ML IJ SOLN
INTRAMUSCULAR | Status: AC
  Filled 2021-06-10: qty 2

## 2021-06-10 MED ORDER — ONDANSETRON HCL 4 MG/2ML IJ SOLN
4.0000 mg | Freq: Four times a day (QID) | INTRAMUSCULAR | Status: DC | PRN
  Administered 2021-06-11: 4 mg via INTRAVENOUS
  Filled 2021-06-10: qty 2

## 2021-06-10 MED ORDER — LIDOCAINE HCL (PF) 2 % IJ SOLN
INTRAMUSCULAR | Status: AC
  Filled 2021-06-10: qty 5

## 2021-06-10 MED ORDER — FENTANYL CITRATE PF 50 MCG/ML IJ SOSY
PREFILLED_SYRINGE | INTRAMUSCULAR | Status: AC
  Filled 2021-06-10: qty 2

## 2021-06-10 MED ORDER — DOCUSATE SODIUM 100 MG PO CAPS: 100.0000 mg | ORAL_CAPSULE | Freq: Two times a day (BID) | ORAL | 1 refills | Status: AC | PRN

## 2021-06-10 MED ORDER — MIDAZOLAM HCL 2 MG/2ML IJ SOLN
INTRAMUSCULAR | Status: AC
  Administered 2021-06-10: 2 mg via INTRAVENOUS
  Filled 2021-06-10: qty 2

## 2021-06-10 MED ORDER — POTASSIUM CHLORIDE IN NACL 20-0.45 MEQ/L-% IV SOLN
INTRAVENOUS | Status: DC
  Filled 2021-06-10 (×2): qty 1000

## 2021-06-10 MED ORDER — ORAL CARE MOUTH RINSE: 15.0000 mL | Freq: Once | OROMUCOSAL | Status: AC

## 2021-06-10 MED ORDER — ACETAMINOPHEN 10 MG/ML IV SOLN
1000.0000 mg | INTRAVENOUS | Status: AC
  Administered 2021-06-10: 1000 mg via INTRAVENOUS
  Filled 2021-06-10: qty 100

## 2021-06-10 MED ORDER — DOCUSATE SODIUM 100 MG PO CAPS
100.0000 mg | ORAL_CAPSULE | Freq: Two times a day (BID) | ORAL | Status: DC
  Administered 2021-06-10 – 2021-06-12 (×4): 100 mg via ORAL
  Filled 2021-06-10 (×4): qty 1

## 2021-06-10 MED ORDER — CEFAZOLIN SODIUM-DEXTROSE 2-4 GM/100ML-% IV SOLN
2.0000 g | Freq: Four times a day (QID) | INTRAVENOUS | Status: AC
  Administered 2021-06-10 – 2021-06-11 (×3): 2 g via INTRAVENOUS
  Filled 2021-06-10 (×3): qty 100

## 2021-06-10 MED ORDER — PRONTOSAN WOUND IRRIGATION OPTIME
TOPICAL | Status: DC | PRN
  Administered 2021-06-10: 350 mL via TOPICAL

## 2021-06-10 MED ORDER — ALUM & MAG HYDROXIDE-SIMETH 200-200-20 MG/5ML PO SUSP
30.0000 mL | ORAL | Status: DC | PRN
Start: 2021-06-10 — End: 2021-06-12
  Administered 2021-06-11: 30 mL via ORAL
  Filled 2021-06-10: qty 30

## 2021-06-10 SURGICAL SUPPLY — 78 items
AGENT HMST SPONGE THK3/8 (HEMOSTASIS)
ATTUNE MED DOME PAT 38 KNEE (Knees) ×1 IMPLANT
ATTUNE PS FEM RT SZ 7 CEM KNEE (Femur) ×1 IMPLANT
ATTUNE PSRP INSR SZ7 5 KNEE (Insert) ×1 IMPLANT
BAG COUNTER SPONGE SURGICOUNT (BAG) ×1 IMPLANT
BAG DECANTER FOR FLEXI CONT (MISCELLANEOUS) ×2 IMPLANT
BAG SPEC THK2 15X12 ZIP CLS (MISCELLANEOUS) ×1
BAG SPNG CNTER NS LX DISP (BAG) ×1
BAG ZIPLOCK 12X15 (MISCELLANEOUS) ×1 IMPLANT
BASE TIBIA ATTUNE KNEE SYS SZ6 (Knees) IMPLANT
BLADE SAW SGTL 11.0X1.19X90.0M (BLADE) ×3 IMPLANT
BLADE SAW SGTL 13.0X1.19X90.0M (BLADE) ×3 IMPLANT
BLADE SURG SZ10 CARB STEEL (BLADE) ×6 IMPLANT
BNDG COHESIVE 4X5 TAN ST LF (GAUZE/BANDAGES/DRESSINGS) ×3 IMPLANT
BNDG ELASTIC 4X5.8 VLCR STR LF (GAUZE/BANDAGES/DRESSINGS) ×3 IMPLANT
BNDG ELASTIC 6X5.8 VLCR STR LF (GAUZE/BANDAGES/DRESSINGS) ×3 IMPLANT
BOWL SMART MIX CTS (DISPOSABLE) ×3 IMPLANT
BSPLAT TIB 6 CMNT ROT PLAT STR (Knees) ×1 IMPLANT
CEMENT HV SMART SET (Cement) ×6 IMPLANT
COVER SURGICAL LIGHT HANDLE (MISCELLANEOUS) ×3 IMPLANT
CUFF TOURN SGL QUICK 34 (TOURNIQUET CUFF) ×2
CUFF TRNQT CYL 34X4.125X (TOURNIQUET CUFF) ×2 IMPLANT
DRAPE INCISE IOBAN 66X45 STRL (DRAPES) IMPLANT
DRAPE ORTHO SPLIT 77X108 STRL (DRAPES) ×4
DRAPE SHEET LG 3/4 BI-LAMINATE (DRAPES) ×6 IMPLANT
DRAPE SURG ORHT 6 SPLT 77X108 (DRAPES) ×4 IMPLANT
DRAPE U-SHAPE 47X51 STRL (DRAPES) ×3 IMPLANT
DRESSING AQUACEL AG SP 3.5X10 (GAUZE/BANDAGES/DRESSINGS) IMPLANT
DRSG AQUACEL AG ADV 3.5X10 (GAUZE/BANDAGES/DRESSINGS) ×3 IMPLANT
DRSG AQUACEL AG SP 3.5X10 (GAUZE/BANDAGES/DRESSINGS) ×2
DRSG TEGADERM 4X4.75 (GAUZE/BANDAGES/DRESSINGS) IMPLANT
DURAPREP 26ML APPLICATOR (WOUND CARE) ×3 IMPLANT
ELECT BLADE TIP CTD 4 INCH (ELECTRODE) IMPLANT
ELECT REM PT RETURN 15FT ADLT (MISCELLANEOUS) ×3 IMPLANT
EVACUATOR 1/8 PVC DRAIN (DRAIN) IMPLANT
GAUZE SPONGE 2X2 8PLY STRL LF (GAUZE/BANDAGES/DRESSINGS) IMPLANT
GLOVE BIO SURGEON STRL SZ7 (GLOVE) ×3 IMPLANT
GLOVE BIOGEL PI IND STRL 7.0 (GLOVE) ×2 IMPLANT
GLOVE BIOGEL PI IND STRL 8 (GLOVE) ×2 IMPLANT
GLOVE BIOGEL PI INDICATOR 7.0 (GLOVE) ×1
GLOVE BIOGEL PI INDICATOR 8 (GLOVE) ×1
GLOVE SURG SS PI 8.0 STRL IVOR (GLOVE) ×6 IMPLANT
GOWN STRL REUS W/ TWL XL LVL3 (GOWN DISPOSABLE) ×4 IMPLANT
GOWN STRL REUS W/TWL XL LVL3 (GOWN DISPOSABLE) ×4
HANDPIECE INTERPULSE COAX TIP (DISPOSABLE) ×2
HEMOSTAT SPONGE AVITENE ULTRA (HEMOSTASIS) IMPLANT
HOLDER FOLEY CATH W/STRAP (MISCELLANEOUS) ×1 IMPLANT
IMMOBILIZER KNEE 20 (SOFTGOODS) ×2
IMMOBILIZER KNEE 20 THIGH 36 (SOFTGOODS) ×2 IMPLANT
KIT TURNOVER KIT A (KITS) IMPLANT
MANIFOLD NEPTUNE II (INSTRUMENTS) ×3 IMPLANT
NS IRRIG 1000ML POUR BTL (IV SOLUTION) ×1 IMPLANT
PACK TOTAL KNEE CUSTOM (KITS) ×3 IMPLANT
PROTECTOR NERVE ULNAR (MISCELLANEOUS) ×3 IMPLANT
SAW OSC TIP CART 19.5X105X1.3 (SAW) ×3 IMPLANT
SEALER BIPOLAR AQUA 6.0 (INSTRUMENTS) ×1 IMPLANT
SET HNDPC FAN SPRY TIP SCT (DISPOSABLE) ×2 IMPLANT
SOLUTION PRONTOSAN WOUND 350ML (IRRIGATION / IRRIGATOR) ×3 IMPLANT
SPIKE FLUID TRANSFER (MISCELLANEOUS) ×2 IMPLANT
SPONGE GAUZE 2X2 STER 10/PKG (GAUZE/BANDAGES/DRESSINGS)
STAPLER VISISTAT (STAPLE) ×1 IMPLANT
STRIP CLOSURE SKIN 1/2X4 (GAUZE/BANDAGES/DRESSINGS) ×2 IMPLANT
SUT BONE WAX W31G (SUTURE) ×3 IMPLANT
SUT MNCRL AB 4-0 PS2 18 (SUTURE) IMPLANT
SUT STRATAFIX 0 PDS 27 VIOLET (SUTURE) ×4
SUT VIC AB 1 CT1 27 (SUTURE) ×4
SUT VIC AB 1 CT1 27XBRD ANTBC (SUTURE) ×4 IMPLANT
SUT VIC AB 1 CTX 36 (SUTURE) ×6
SUT VIC AB 1 CTX36XBRD ANBCTR (SUTURE) ×6 IMPLANT
SUT VIC AB 2-0 CT1 27 (SUTURE) ×6
SUT VIC AB 2-0 CT1 TAPERPNT 27 (SUTURE) ×6 IMPLANT
SUTURE STRATFX 0 PDS 27 VIOLET (SUTURE) ×2 IMPLANT
SYR 3ML LL SCALE MARK (SYRINGE) IMPLANT
TIBIA ATTUNE KNEE SYS BASE SZ6 (Knees) ×2 IMPLANT
TRAY FOLEY MTR SLVR 16FR STAT (SET/KITS/TRAYS/PACK) ×3 IMPLANT
WATER STERILE IRR 1000ML POUR (IV SOLUTION) ×3 IMPLANT
WIPE CHG 2% PREP (PERSONAL CARE ITEMS) ×3 IMPLANT
WRAP KNEE MAXI GEL POST OP (GAUZE/BANDAGES/DRESSINGS) ×3 IMPLANT

## 2021-06-10 NOTE — Op Note (Unsigned)
NAME: Marcus Patterson, Marcus Patterson. MEDICAL RECORD NO: [id number] ACCOUNT NO: [id number] DATE OF BIRTH: 1945/05/28 FACILITY: [facility] LOCATION: [facility] PHYSICIAN: Javier Docker, MD  Operative Report   DATE OF PROCEDURE: 06/10/2021   PREOPERATIVE DIAGNOSIS:  End-stage osteoarthrosis, varus deformity, right knee.  POSTOPERATIVE DIAGNOSES:  End-stage osteoarthrosis, varus deformity, right knee.  PROCEDURE PERFORMED:  Right total knee arthroplasty utilizing DePuy Attune rotating platform, 7 femur, 7 tibia, 5 mm insert, 38 patella.  ANESTHESIA:  Spinal.  ASSISTANT:  Andrez Grime, PA.  HISTORY:  A 76 year old male with end-stage osteoarthrosis, varus deformity indicated for replacement of the degenerated joint.  Risks and benefits discussed including bleeding, infection, damage to neurovascular structures, no change in symptoms,  worsening symptoms, DVT, PE, anesthetic complications, etc.  DESCRIPTION OF PROCEDURE:  With the patient in supine position after induction of adequate spinal anesthesia, 2 grams Kefzol and timeout was performed, the right lower extremity was prepped and draped and exsanguinated in the usual sterile fashion.   Thigh tourniquet inflated to 225 mmHg.  Midline incision was then made over the knee.  Full thickness flaps developed.  Median parapatellar arthrotomy was performed with minimal soft tissue elevation medially.  The patella gently everted, knee was  flexed.  Tricompartmental osteoarthrosis was noted, bone-on-bone lateral compartment particularly.  I used a Leksell rongeur to perform a notch above the femoral notch there was entry for the femoral drill.  We then entered the canal in line with the  femur.  It was irrigated, used T-handle reamer followed by intramedullary guide, 5-degree right with 9 off the distal femur.  This was pinned and we performed our cut, protecting the soft tissues at all times.  I sized the femur off the anterior cortex  to a 7.  This was pinned in 3  degrees of external rotation.  I then performed the anterior, posterior and chamfer cuts off the distal femoral block protecting the soft tissues posteriorly at all times, I subluxed the tibia.  Low side was medial. Remnants  of the medial and lateral menisci were excised as well as the ACL.  The low side was lateral.  We used the external alignment guide parallel to the shaft bisecting the tibiotalar joint. 2 off the defect, which was posterolateral, which was 9 off the  high side, which was medial. This was pinned and performed our tibial cut protecting the soft tissues at all times.  I then checked our extension block and it was satisfactory with a 5.  The flexion block was symmetric as well.  I then subluxed the tibia  protected the patella, sized the femur, the tibia to a 7.  I then placed our baseplate, harvested bone from the tibia and impacted into the distal femur.  I then drilled centrally, used our punch guide.  I then used a box cut guide on the femur,  bisecting the canal.  This was pinned and performed our box cut.  I then placed a trial femur, which fit flush.  Drilled our lug holes.  Placed a 6 mm insert, reduced the knee, had slight flexion, I then everted the patella, measured at 25, planed it to  a 15 utilizing the patellar jig.  I then used a trial paddle parallel to the joint, medializing her peg holes.  These were then drilled.  Placed a 38 trial patella, reduced it and had excellent patellofemoral tracking.  Then, all instrumentation was  removed.  I checked posteriorly, removed the large loose body.  Cauterized the geniculates  with the Aquamantys.  I then used a pulsatile lavage for copious irrigation.  I then flexed the knee, subluxed the tibia.  All surfaces thoroughly dried.  Mixed  cement on back table in appropriate fashion under vacuum.  I then placed cement on the tibial component and also into the tibia, digitally pressurizing it.  I impacted the tibia with redundant cement  removed.  I cemented and impacted the femur again  cemented on both.  I placed a 5 mm insert, reduced the knee and held in axial load throughout the curing of the cement.  I then cemented and clamped the patella.  Redundant cement was removed.  I placed Marcaine with epinephrine into the joint followed  by Prontosan.  The wound was covered during the curing of the cement.  After that, the tourniquet was deflated at 58 minutes.  Any bleeding which was minimal was cauterized.  Had full flexion, full extension, good stability to varus valgus stressing at  0-30 degrees, negative anterior drawer.  I then removed the trial 5 insert.  Meticulously removed all redundant cement.  Copiously irrigated with pulsatile lavage and Prontosan.  Flexed the knee, placed a 5 permanent insert polyethylene, reduced the knee  and had full extension and full flexion, good stability to varus valgus stressing at 0-30 degrees, negative anterior drawer.  Following this, a slight flexion, I reapproximated patellar arthrotomy with the 1 Vicryl interrupted figure-of-eight sutures.   Oversewn with a running Stratafix.  Following this, copious irrigation, subcutaneous with 2-0 and skin with staples.  Flexion to gravity at 90 degrees.  Following this, a sterile dressing was applied.  He was placed in hospital bed and transported to the  recovery room in the satisfactory condition.  The patient tolerated the procedure well.  No complications.   ASSISTANT:  Andrez Grime, PA.    Minimal blood loss.    TOURNIQUET TIME: 58 minutes.    SUJ D: 06/10/2021 3:23:26 pm T: 06/10/2021 11:38:00 pm  JOB: [id number]/ [id number]

## 2021-06-10 NOTE — Anesthesia Postprocedure Evaluation (Signed)
Anesthesia Post Note  Patient: Marcus Patterson  Procedure(s) Performed: TOTAL KNEE ARTHROPLASTY (Right: Knee)     Patient location during evaluation: PACU Anesthesia Type: Regional, MAC and Spinal Level of consciousness: awake and alert Pain management: pain level controlled Vital Signs Assessment: post-procedure vital signs reviewed and stable Respiratory status: spontaneous breathing, nonlabored ventilation, respiratory function stable and patient connected to nasal cannula oxygen Cardiovascular status: stable and blood pressure returned to baseline Postop Assessment: no apparent nausea or vomiting Anesthetic complications: no   No notable events documented.  Last Vitals:  Vitals:   06/10/21 1713 06/10/21 1937  BP: (!) 154/87 120/74  Pulse: 60 61  Resp: 18 18  Temp: 36.5 C 36.8 C  SpO2: 99% 94%    Last Pain:  Vitals:   06/10/21 1630  TempSrc:   PainSc: 0-No pain                 Belenda Cruise P Angelik Walls

## 2021-06-10 NOTE — Discharge Instructions (Signed)

## 2021-06-10 NOTE — Anesthesia Procedure Notes (Signed)
Spinal  Patient location during procedure: OR Start time: 06/10/2021 1:18 PM End time: 06/10/2021 1:19 PM Staffing Performed: anesthesiologist  Anesthesiologist: Atilano Median, DO Performed by: Atilano Median, DO Authorized by: Atilano Median, DO   Preanesthetic Checklist Completed: patient identified, IV checked, site marked, risks and benefits discussed, surgical consent, monitors and equipment checked, pre-op evaluation and timeout performed Spinal Block Patient position: sitting Prep: DuraPrep Patient monitoring: heart rate, cardiac monitor, continuous pulse ox and blood pressure Approach: midline Location: L4-5 Injection technique: single-shot Needle Needle type: Pencan  Needle gauge: 24 G Needle length: 10 cm Assessment Events: CSF return Additional Notes Patient identified. Risks/Benefits/Options discussed with patient including but not limited to bleeding, infection, nerve damage, paralysis, failed block, incomplete pain control, headache, blood pressure changes, nausea, vomiting, reactions to medications, itching and postpartum back pain. Confirmed with bedside nurse the patient's most recent platelet count. Confirmed with patient that they are not currently taking any anticoagulation, have any bleeding history or any family history of bleeding disorders. Patient expressed understanding and wished to proceed. All questions were answered. Sterile technique was used throughout the entire procedure. Please see nursing notes for vital signs. Warning signs of high block given to the patient including shortness of breath, tingling/numbness in hands, complete motor block, or any concerning symptoms with instructions to call for help. Patient was given instructions on fall risk and not to get out of bed. All questions and concerns addressed with instructions to call with any issues or inadequate analgesia.

## 2021-06-10 NOTE — Transfer of Care (Signed)
Immediate Anesthesia Transfer of Care Note  Patient: Marcus Patterson  Procedure(s) Performed: TOTAL KNEE ARTHROPLASTY (Right: Knee)  Patient Location: PACU  Anesthesia Type:Spinal and MAC combined with regional for post-op pain  Level of Consciousness: awake, alert , oriented and patient cooperative  Airway & Oxygen Therapy: Patient Spontanous Breathing and Patient connected to face mask oxygen  Post-op Assessment: Report given to RN and Post -op Vital signs reviewed and stable  Post vital signs: Reviewed and stable  Last Vitals:  Vitals Value Taken Time  BP 125/81 06/10/21 1531  Temp    Pulse 61 06/10/21 1534  Resp 14 06/10/21 1534  SpO2 99 % 06/10/21 1534  Vitals shown include unvalidated device data.  Last Pain:  Vitals:   06/10/21 1245  TempSrc:   PainSc: 0-No pain      Patients Stated Pain Goal: 3 (77/41/42 3953)  Complications: No notable events documented.

## 2021-06-10 NOTE — Brief Op Note (Signed)
06/10/2021  1:02 PM  PATIENT:  Marcus Patterson  76 y.o. male  PRE-OPERATIVE DIAGNOSIS:  DGENERATIVE JOINT DISEASE RIGHT KNEE  POST-OPERATIVE DIAGNOSIS:  * No post-op diagnosis entered *  PROCEDURE:  Procedure(s): TOTAL KNEE ARTHROPLASTY (Right)  SURGEON:  Surgeon(s) and Role:    Jene Every, MD - Primary  PHYSICIAN ASSISTANT:   ASSISTANTS: Bissell   ANESTHESIA:   spinal  EBL:  25   BLOOD ADMINISTERED:none  DRAINS: none   LOCAL MEDICATIONS USED:  MARCAINE     SPECIMEN:  No Specimen  DISPOSITION OF SPECIMEN:  N/A  COUNTS:  YES  TOURNIQUET:  DICTATION: .Other Dictation: Dictation Number   [id number]  PLAN OF CARE: Admit for overnight observation  PATIENT DISPOSITION:  PACU - hemodynamically stable.   Delay start of Pharmacological VTE agent (>24hrs) due to surgical blood loss or risk of bleeding: no

## 2021-06-10 NOTE — Plan of Care (Signed)
Problem: Education: Goal: Ability to describe self-care measures that may prevent or decrease complications (Diabetes Survival Skills Education) will improve Outcome: Progressing   Problem: Health Behavior/Discharge Planning: Goal: Ability to identify and utilize available resources and services will improve Outcome: Progressing   Problem: Clinical Measurements: Goal: Ability to maintain clinical measurements within normal limits will improve Outcome: Progressing   Haydee Salter, RN 06/10/21 5:59 PM

## 2021-06-10 NOTE — Interval H&P Note (Signed)
History and Physical Interval Note:  06/10/2021 12:46 PM  Marcus Patterson  has presented today for surgery, with the diagnosis of DGENERATIVE JOINT DISEASE RIGHT KNEE.  The various methods of treatment have been discussed with the patient and family. After consideration of risks, benefits and other options for treatment, the patient has consented to  Procedure(s): TOTAL KNEE ARTHROPLASTY (Right) as a surgical intervention.  The patient's history has been reviewed, patient examined, no change in status, stable for surgery.  I have reviewed the patient's chart and labs.  Questions were answered to the patient's satisfaction.     Javier Docker

## 2021-06-10 NOTE — Anesthesia Preprocedure Evaluation (Addendum)
Anesthesia Evaluation  Patient identified by MRN, date of birth, ID band Patient awake    Reviewed: Allergy & Precautions, NPO status , Patient's Chart, lab work & pertinent test results  Airway Mallampati: II  TM Distance: >3 FB Neck ROM: Full    Dental no notable dental hx.    Pulmonary neg pulmonary ROS, former smoker,    Pulmonary exam normal        Cardiovascular hypertension, Pt. on medications and Pt. on home beta blockers + CAD, + Past MI and + Cardiac Stents  + pacemaker  Rhythm:Regular Rate:Normal     Neuro/Psych  Headaches, Anxiety    GI/Hepatic negative GI ROS, Neg liver ROS,   Endo/Other  diabetes, Type 2  Renal/GU   negative genitourinary   Musculoskeletal negative musculoskeletal ROS (+)   Abdominal Normal abdominal exam  (+)   Peds  Hematology negative hematology ROS (+)   Anesthesia Other Findings   Reproductive/Obstetrics                             Anesthesia Physical Anesthesia Plan  ASA: 3  Anesthesia Plan: MAC, Spinal and Regional   Post-op Pain Management: Regional block*   Induction: Intravenous  PONV Risk Score and Plan: 1 and Ondansetron, Dexamethasone, Propofol infusion, TIVA, Midazolam and Treatment may vary due to age or medical condition  Airway Management Planned: Simple Face Mask, Natural Airway and Nasal Cannula  Additional Equipment: None  Intra-op Plan:   Post-operative Plan:   Informed Consent: I have reviewed the patients History and Physical, chart, labs and discussed the procedure including the risks, benefits and alternatives for the proposed anesthesia with the patient or authorized representative who has indicated his/her understanding and acceptance.     Dental advisory given  Plan Discussed with: CRNA  Anesthesia Plan Comments: (Lab Results      Component                Value               Date                      WBC                       6.9                 06/01/2021                HGB                      16.6                06/01/2021                HCT                      49.9                06/01/2021                MCV                      94.2                06/01/2021                PLT  159                 06/01/2021           Lab Results      Component                Value               Date                      NA                       141                 06/01/2021                K                        4.9                 06/01/2021                CO2                      26                  06/01/2021                GLUCOSE                  117 (H)             06/01/2021                BUN                      18                  06/01/2021                CREATININE               0.75                06/01/2021                CALCIUM                  9.5                 06/01/2021                GFRNONAA                 >60                 06/01/2021           NM Stress testing 01/07/2020 Conclusions 1. Small mild basal inferolateral reversible defect, favor artifact over ischemia 2. Overall left ventricular systolic function is normal. The left ventricular end-diastolic volume is 63. The overall calculated left ventricular ejection fraction is 67%. No left ventricular regional wall motion abnormalities 3. Low cardiovascular risk study.   Echo 01/05/2020 There is mild concentric left ventricular hypertrophy The left ventricle is normal in size and systolic function: Ejection Fraction = 55-60% The right ventricle is normal in size and function There is a pacemaker lead in the right ventricle There  is mild tricuspid regurgitation The inferior vena cava was not visualized during the exam. )        Anesthesia Quick Evaluation

## 2021-06-10 NOTE — Anesthesia Procedure Notes (Addendum)
Anesthesia Regional Block: Adductor canal block   Pre-Anesthetic Checklist: , timeout performed,  Correct Patient, Correct Site, Correct Laterality,  Correct Procedure, Correct Position, site marked,  Risks and benefits discussed,  Surgical consent,  Pre-op evaluation,  At surgeon's request and post-op pain management  Laterality: Right  Prep: Dura Prep       Needles:  Injection technique: Single-shot  Needle Type: Echogenic Stimulator Needle     Needle Length: 10cm  Needle Gauge: 20     Additional Needles:   Procedures:,,,, ultrasound used (permanent image in chart),,    Narrative:  Start time: 06/10/2021 12:38 PM End time: 06/10/2021 12:41 PM Injection made incrementally with aspirations every 5 mL.  Performed by: Personally  Anesthesiologist: Atilano Median, DO  Additional Notes: Patient identified. Risks/Benefits/Options discussed with patient including but not limited to bleeding, infection, nerve damage, failed block, incomplete pain control. Patient expressed understanding and wished to proceed. All questions were answered. Sterile technique was used throughout the entire procedure. Please see nursing notes for vital signs. Aspirated in 5cc intervals with injection for negative confirmation. Patient was given instructions on fall risk and not to get out of bed. All questions and concerns addressed with instructions to call with any issues or inadequate analgesia.

## 2021-06-10 NOTE — Progress Notes (Signed)
Patient states he does not have appropriate rolling walker at home. He has one with a seat. [facility] payor source. Haydee Salter, RN 06/10/21 5:46 PM

## 2021-06-11 DIAGNOSIS — M1711 Unilateral primary osteoarthritis, right knee: Secondary | ICD-10-CM | POA: Diagnosis not present

## 2021-06-11 LAB — BASIC METABOLIC PANEL
Anion gap: 6 (ref 5–15)
BUN: 18 mg/dL (ref 8–23)
CO2: 25 mmol/L (ref 22–32)
Calcium: 9.1 mg/dL (ref 8.9–10.3)
Chloride: 108 mmol/L (ref 98–111)
Creatinine, Ser: 0.84 mg/dL (ref 0.61–1.24)
GFR, Estimated: >60 mL/min (ref >60)
Glucose, Bld: 263 mg/dL — ABNORMAL HIGH (ref 70–99)
Potassium: 4.5 mmol/L (ref 3.5–5.1)
Sodium: 139 mmol/L (ref 135–145)

## 2021-06-11 LAB — CBC
HCT: 40.8 % (ref 39.0–52.0)
Hemoglobin: 13.7 g/dL (ref 13.0–17.0)
MCH: 31.4 pg (ref 26.0–34.0)
MCHC: 33.6 g/dL (ref 30.0–36.0)
MCV: 93.4 fL (ref 80.0–100.0)
Platelets: 159 10*3/uL (ref 150–400)
RBC: 4.37 MIL/uL (ref 4.22–5.81)
RDW: 13.2 % (ref 11.5–15.5)
WBC: 9.3 10*3/uL (ref 4.0–10.5)
nRBC: 0 % (ref 0.0–0.2)

## 2021-06-11 LAB — GLUCOSE, CAPILLARY
Glucose-Capillary: 182 mg/dL — ABNORMAL HIGH (ref 70–99)
Glucose-Capillary: 172 mg/dL — ABNORMAL HIGH (ref 70–99)
Glucose-Capillary: 136 mg/dL — ABNORMAL HIGH (ref 70–99)
Glucose-Capillary: 170 mg/dL — ABNORMAL HIGH (ref 70–99)

## 2021-06-11 MED ORDER — MELATONIN 3 MG PO TABS
3.0000 mg | ORAL_TABLET | Freq: Every evening | ORAL | Status: DC | PRN
  Administered 2021-06-11: 3 mg via ORAL
  Filled 2021-06-11: qty 1

## 2021-06-11 MED ORDER — HYDROCODONE-ACETAMINOPHEN 7.5-325 MG PO TABS
1.0000 | ORAL_TABLET | ORAL | Status: DC | PRN
  Administered 2021-06-11 – 2021-06-12 (×3): 2 via ORAL
  Filled 2021-06-11 (×3): qty 2

## 2021-06-11 MED ORDER — MELATONIN 3 MG PO TABS: 3.0000 mg | ORAL_TABLET | Freq: Every day | ORAL | Status: DC

## 2021-06-11 MED ORDER — HYDROCODONE-ACETAMINOPHEN 5-325 MG PO TABS
1.0000 | ORAL_TABLET | ORAL | Status: DC | PRN
  Administered 2021-06-12: 2 via ORAL
  Filled 2021-06-11 (×2): qty 2

## 2021-06-11 NOTE — Progress Notes (Signed)
Marcus Patterson  MRN: [id number] DOB/Age: 1945-01-20 76 y.o. Physician: Lynnea Maizes, M.D. 1 Day Post-Op Procedure(s) (LRB): TOTAL KNEE ARTHROPLASTY (Right)  Subjective: Patient reports overall doing well.  Rested comfortably last night.  Primary complaint is some localized pain about the lateral aspect of the right leg just above the ankle.  Denies nausea or vomiting. Vital Signs Temp:  [97.4 F (36.3 C)-99 F (37.2 C)] 97.6 F (36.4 C) (06/10 0431) Pulse Rate:  [59-71] 62 (06/10 0809) Resp:  [13-22] 18 (06/10 0431) BP: (113-160)/(55-110) 136/70 (06/10 0431) SpO2:  [91 %-100 %] 96 % (06/10 0431) Weight:  [83.5 kg] 83.5 kg (06/09 1740)  Lab Results Recent Labs    06/11/21 0349  WBC 9.3  HGB 13.7  HCT 40.8  PLT 159   BMET Recent Labs    06/11/21 0349  NA 139  K 4.5  CL 108  CO2 25  GLUCOSE 263*  BUN 18  CREATININE 0.84  CALCIUM 9.1   INR  Date Value Ref Range Status  03/27/2018 1.1 0.8 - 1.2 Final    Comment:    (NOTE) INR goal varies based on device and disease states. Performed at [facility], [address] [place], [place] [address]      Exam  Knee immobilizer and postop Ace bandages are removed.  Compartments are all soft.  Inspection of the skin about the ankle reveals some mild creasing related to the Ace bandage but certainly no skin breakdown or erythema.  He has good ankle motion and is neurovascular intact distally.  Aquacel dressing is intact with no drainage.  Plan Patient anxious to go home if cleared by PT.  Awaiting therapy.  Otherwise discharge home following standard total knee protocol.  Follow-up with Dr. Shelle Iron as per protocol. Marcus Patterson Patterson 06/11/2021, 9:25 AM   Contact # [phone]

## 2021-06-11 NOTE — TOC Transition Note (Signed)
Transition of Care [facility]) - CM/SW Discharge Note   Patient Details  Name: Marcus Patterson MRN: [id number] Date of Birth: 01/19/1945  Transition of Care [facility]) CM/SW Contact:  Darleene Cleaver, LCSW Phone Number: 06/11/2021, 4:11 PM   Clinical Narrative:     CSW spoke to patient to find what equipment he needed.  Per patient he has a walker at home, but needs a rolling walker, and he also needs a 3 in 1.  CSW asked if he had any other insurance besides [place], because, CSW has to fax referral to [place], so it will take a few days for them to get back with him.  Patient stated he does not, only uses [place].  CSW told patient that the orders will be faxed to the [place], and they will reach out to him to determine when the equipment can be delivered.  Patient stated that will be fine, he said he has a walker without wheels that he can use until the new is delivered.  CSW faxed orders to [facility].   Patient has home health pre arranged through [facility] at ortho doctor's office.  CSW signing off, please reconsult if other social work needs arise.     Final next level of care: Home w Home Health Services Barriers to Discharge: Barriers Resolved   Patient Goals and CMS Choice Patient states their goals for this hospitalization and ongoing recovery are:: To return back home. CMS Medicare.gov Compare Post Acute Care list provided to:: Patient Choice offered to / list presented to : Patient  Discharge Placement                       Discharge Plan and Services                DME Arranged: 3-N-1, Walker rolling DME Agency: [facility], [place] Date DME Agency Contacted: 06/11/21 Time DME Agency Contacted: 1610   HH Arranged: PT HH Agency: [facility] Date HH Agency Contacted: 06/11/21 Time HH Agency Contacted: 1611    Social Determinants of Health (SDOH) Interventions     Readmission Risk Interventions     No data to display

## 2021-06-11 NOTE — Plan of Care (Signed)
  Problem: Education: Goal: Knowledge of General Education information will improve Description Including pain rating scale, medication(s)/side effects and non-pharmacologic comfort measures Outcome: Progressing   Problem: Health Behavior/Discharge Planning: Goal: Ability to manage health-related needs will improve Outcome: Progressing   

## 2021-06-11 NOTE — Progress Notes (Signed)
Physical Therapy Treatment Patient Details Name: Marcus Patterson MRN: [id number] DOB: May 19, 1945 Today's Date: 06/11/2021   History of Present Illness [name] and with hc of DM, CAD, MI, vertigo, PTSD, and agent orange exposure    PT Comments    Pt in good spirits and performed therex program with min assist but on standing c/o dizziness and returned to sitting - BP 163/101 - RN aware.  Pt states "sometimes this happens at home".   Recommendations for follow up therapy are one component of a multi-disciplinary discharge planning process, led by the attending physician.  Recommendations may be updated based on patient status, additional functional criteria and insurance authorization.  Follow Up Recommendations  Follow physician's recommendations for discharge plan and follow up therapies     Assistance Recommended at Discharge Intermittent Supervision/Assistance  Patient can return home with the following A little help with walking and/or transfers;A little help with bathing/dressing/bathroom;Assistance with cooking/housework;Assist for transportation;Help with stairs or ramp for entrance   Equipment Recommendations  Rolling walker (2 wheels)    Recommendations for Other Services       Precautions / Restrictions Precautions Precautions: Knee;Fall Required Braces or Orthoses: Knee Immobilizer - Right Knee Immobilizer - Right: Discontinue once straight leg raise with < 10 degree lag (pt performed IND SLR) Restrictions Weight Bearing Restrictions: No RLE Weight Bearing: Weight bearing as tolerated     Mobility  Bed Mobility               General bed mobility comments: Pt up in recliner and returned to same    Transfers Overall transfer level: Needs assistance Equipment used: Rolling walker (2 wheels) Transfers: Sit to/from Stand Sit to Stand: Min guard           General transfer comment: cues for LE management and use of UEs to self assist     Ambulation/Gait Ambulation/Gait assistance: Min assist, Min guard Gait Distance (Feet): 75 Feet Assistive device: Rolling walker (2 wheels) Gait Pattern/deviations: Step-to pattern, Decreased step length - right, Decreased step length - left, Shuffle, Trunk flexed Gait velocity: decr     General Gait Details: Pt stood only, became dizzy and diaphoretic and returned to sitting   Stairs             Wheelchair Mobility    Modified Rankin (Stroke Patients Only)       Balance Overall balance assessment: Needs assistance Sitting-balance support: Feet supported, No upper extremity supported Sitting balance-Leahy Scale: Good     Standing balance support: Single extremity supported Standing balance-Leahy Scale: Fair                              Cognition Arousal/Alertness: Awake/alert Behavior During Therapy: WFL for tasks assessed/performed Overall Cognitive Status: Within Functional Limits for tasks assessed                                          Exercises Total Joint Exercises Ankle Circles/Pumps: AROM, Both, 15 reps, Supine Quad Sets: AROM, Both, 10 reps, Supine Heel Slides: AAROM, Right, 10 reps, Supine Straight Leg Raises: AAROM, AROM, Right, Supine, 15 reps    General Comments        Pertinent Vitals/Pain Pain Assessment Pain Assessment: 0-10 Pain Score: 4  Pain Location: R knee Pain Descriptors / Indicators: Aching, Sore Pain Intervention(s):  Limited activity within patient's tolerance, Monitored during session, Premedicated before session    Home Living Family/patient expects to be discharged to:: Private residence Living Arrangements: Spouse/significant other Available Help at Discharge: Family;Available 24 hours/day Type of Home: House Home Access: Stairs to enter   [employer] of Steps: 3   Home Layout: One level Home Equipment: None;Standard Walker;Cane - single point      Prior Function             PT Goals (current goals can now be found in the care plan section) Acute Rehab PT Goals Patient Stated Goal: Regain IND PT Goal Formulation: With patient Time For Goal Achievement: 06/18/21 Potential to Achieve Goals: Good Progress towards PT goals: Not progressing toward goals - comment (diaphoretic and dizzy with move to standing)    Frequency    7X/week      PT Plan Current plan remains appropriate    Co-evaluation              AM-PAC PT "6 Clicks" Mobility   Outcome Measure  Help needed turning from your back to your side while in a flat bed without using bedrails?: A Little Help needed moving from lying on your back to sitting on the side of a flat bed without using bedrails?: A Little Help needed moving to and from a bed to a chair (including a wheelchair)?: A Little Help needed standing up from a chair using your arms (e.g., wheelchair or bedside chair)?: A Little Help needed to walk in hospital room?: A Little Help needed climbing 3-5 steps with a railing? : A Little 6 Click Score: 18    End of Session Equipment Utilized During Treatment: Gait belt Activity Tolerance: Patient tolerated treatment well Patient left: in chair;with call bell/phone within reach;with chair alarm set;with family/visitor present Nurse Communication: Mobility status PT Visit Diagnosis: Difficulty in walking, not elsewhere classified (R26.2)     Time: 1335-1400 PT Time Calculation (min) (ACUTE ONLY): 25 min  Charges:  $Therapeutic Exercise: 8-22 mins $Therapeutic Activity: 8-22 mins                     Mauro Kaufmann PT Acute Rehabilitation Services Pager [phone] Office [phone]    Dione Petron 06/11/2021, 2:19 PM

## 2021-06-11 NOTE — Progress Notes (Signed)
Notified Ralene Bathe, PA to let her know that patient was not ready to be discharged today. Patient had a successful PT session, but then got nauseous and clammy. Just gave IV Zofran. Patient was also concerned that Oxycodone may be causing nausea. Received verbal order from [facility], PA to put in order for Norco 1-2 tab q4 hr PRN. Patient's discharge date canceled. Will continue to monitor patient's pain and nausea.

## 2021-06-11 NOTE — Evaluation (Signed)
Physical Therapy Evaluation Patient Details Name: Marcus Patterson MRN: [id number] DOB: 11-05-1945 Today's Date: 06/11/2021  History of Present Illness  [name] and with hc of DM, CAD, MI, vertigo, PTSD, and agent orange exposure  Clinical Impression  [name] and presents with decreased R LE strength/ROM and post op pain limiting functional mobility.  Pt should progress to dc home with family assist and reports will have HHPT initially.     Recommendations for follow up therapy are one component of a multi-disciplinary discharge planning process, led by the attending physician.  Recommendations may be updated based on patient status, additional functional criteria and insurance authorization.  Follow Up Recommendations Follow physician's recommendations for discharge plan and follow up therapies    Assistance Recommended at Discharge Intermittent Supervision/Assistance  Patient can return home with the following  A little help with walking and/or transfers;A little help with bathing/dressing/bathroom;Assistance with cooking/housework;Assist for transportation;Help with stairs or ramp for entrance    Equipment Recommendations Rolling walker (2 wheels)  Recommendations for Other Services       Functional Status Assessment Patient has had a recent decline in their functional status and demonstrates the ability to make significant improvements in function in a reasonable and predictable amount of time.     Precautions / Restrictions Precautions Precautions: Knee;Fall Required Braces or Orthoses: Knee Immobilizer - Right Knee Immobilizer - Right: Discontinue once straight leg raise with < 10 degree lag Restrictions Weight Bearing Restrictions: No RLE Weight Bearing: Weight bearing as tolerated      Mobility  Bed Mobility               General bed mobility comments: NT - pt up with nursing for bathroom on arrival to room    Transfers Overall transfer level: Needs  assistance Equipment used: Rolling walker (2 wheels) Transfers: Sit to/from Stand Sit to Stand: Min assist, Min guard           General transfer comment: cues for LE management and use of UEs to self assist    Ambulation/Gait Ambulation/Gait assistance: Min assist, Min guard Gait Distance (Feet): 75 Feet Assistive device: Rolling walker (2 wheels) Gait Pattern/deviations: Step-to pattern, Decreased step length - right, Decreased step length - left, Shuffle, Trunk flexed Gait velocity: decr     General Gait Details: cues for sequence, posture and position from [employer]            Wheelchair Mobility    Modified Rankin (Stroke Patients Only)       Balance Overall balance assessment: Needs assistance Sitting-balance support: Feet supported, No upper extremity supported Sitting balance-Leahy Scale: Good     Standing balance support: Single extremity supported Standing balance-Leahy Scale: Fair                               Pertinent Vitals/Pain Pain Assessment Pain Assessment: 0-10 Pain Score: 7  Pain Location: R knee Pain Descriptors / Indicators: Aching, Sore Pain Intervention(s): Limited activity within patient's tolerance, Monitored during session, Premedicated before session, Ice applied    Home Living Family/patient expects to be discharged to:: Private residence Living Arrangements: Spouse/significant other Available Help at Discharge: Family;Available 24 hours/day Type of Home: House Home Access: Stairs to enter   [employer] of Steps: 3   Home Layout: One level Home Equipment: None;Standard Walker;Cane - single point      Prior Function Prior Level of Function :  Independent/Modified Independent                     Hand Dominance        Extremity/Trunk Assessment   Upper Extremity Assessment Upper Extremity Assessment: Overall WFL for tasks assessed    Lower Extremity Assessment Lower Extremity  Assessment: RLE deficits/detail RLE Deficits / Details: 3-/5 quads with AAROM at knee -5 - 60    Cervical / Trunk Assessment Cervical / Trunk Assessment: Normal  Communication   Communication: No difficulties  Cognition Arousal/Alertness: Awake/alert Behavior During Therapy: WFL for tasks assessed/performed Overall Cognitive Status: Within Functional Limits for tasks assessed                                          General Comments      Exercises Total Joint Exercises Ankle Circles/Pumps: AROM, Both, 15 reps, Supine Quad Sets: AROM, Both, 10 reps, Supine Heel Slides: AAROM, Right, 10 reps, Supine Straight Leg Raises: AAROM, AROM, Right, 10 reps, Supine   Assessment/Plan    PT Assessment Patient needs continued PT services  PT Problem List Decreased strength;Decreased range of motion;Decreased activity tolerance;Decreased balance;Decreased mobility;Decreased knowledge of use of DME;Pain       PT Treatment Interventions DME instruction;Gait training;Stair training;Functional mobility training;Therapeutic activities;Therapeutic exercise;Patient/family education    PT Goals (Current goals can be found in the Care Plan section)  Acute Rehab PT Goals Patient Stated Goal: Regain IND PT Goal Formulation: With patient Time For Goal Achievement: 06/18/21 Potential to Achieve Goals: Good    Frequency 7X/week     Co-evaluation               AM-PAC PT "6 Clicks" Mobility  Outcome Measure Help needed turning from your back to your side while in a flat bed without using bedrails?: A Little Help needed moving from lying on your back to sitting on the side of a flat bed without using bedrails?: A Little Help needed moving to and from a bed to a chair (including a wheelchair)?: A Little Help needed standing up from a chair using your arms (e.g., wheelchair or bedside chair)?: A Little Help needed to walk in hospital room?: A Little Help needed climbing 3-5  steps with a railing? : A Little 6 Click Score: 18    End of Session Equipment Utilized During Treatment: Gait belt Activity Tolerance: Patient tolerated treatment well Patient left: in chair;with call bell/phone within reach;with chair alarm set Nurse Communication: Mobility status PT Visit Diagnosis: Difficulty in walking, not elsewhere classified (R26.2)    Time: [id number] PT Time Calculation (min) (ACUTE ONLY): 27 min   Charges:   PT Evaluation $PT Eval Low Complexity: 1 Low PT Treatments $Therapeutic Exercise: 8-22 mins        Debe Coder PT Acute Rehabilitation Services Pager [phone] Office [phone]   Euva Rundell 06/11/2021, 12:49 PM

## 2021-06-12 DIAGNOSIS — M1711 Unilateral primary osteoarthritis, right knee: Secondary | ICD-10-CM | POA: Diagnosis not present

## 2021-06-12 LAB — CBC
HCT: 35.5 % — ABNORMAL LOW (ref 39.0–52.0)
Hemoglobin: 11.9 g/dL — ABNORMAL LOW (ref 13.0–17.0)
MCH: 31.6 pg (ref 26.0–34.0)
MCHC: 33.5 g/dL (ref 30.0–36.0)
MCV: 94.4 fL (ref 80.0–100.0)
Platelets: 158 10*3/uL (ref 150–400)
RBC: 3.76 MIL/uL — ABNORMAL LOW (ref 4.22–5.81)
RDW: 13.7 % (ref 11.5–15.5)
WBC: 10.1 10*3/uL (ref 4.0–10.5)
nRBC: 0 % (ref 0.0–0.2)

## 2021-06-12 LAB — GLUCOSE, CAPILLARY
Glucose-Capillary: 174 mg/dL — ABNORMAL HIGH (ref 70–99)
Glucose-Capillary: 149 mg/dL — ABNORMAL HIGH (ref 70–99)

## 2021-06-12 MED ORDER — ONDANSETRON HCL 4 MG PO TABS: 4.0000 mg | ORAL_TABLET | Freq: Four times a day (QID) | ORAL | 0 refills | Status: AC | PRN

## 2021-06-12 MED ORDER — METOCLOPRAMIDE HCL 10 MG PO TABS: 10.0000 mg | ORAL_TABLET | Freq: Four times a day (QID) | ORAL | 1 refills | Status: AC | PRN

## 2021-06-12 MED ORDER — METOCLOPRAMIDE HCL 5 MG PO TABS
10.0000 mg | ORAL_TABLET | Freq: Three times a day (TID) | ORAL | Status: DC
  Administered 2021-06-12: 10 mg via ORAL
  Filled 2021-06-12: qty 2

## 2021-06-12 MED ORDER — METOCLOPRAMIDE HCL 5 MG/ML IJ SOLN: 5.0000 mg | Freq: Three times a day (TID) | INTRAMUSCULAR | Status: DC

## 2021-06-12 MED ORDER — HYDROCODONE-ACETAMINOPHEN 5-325 MG PO TABS: 1.0000 | ORAL_TABLET | ORAL | 0 refills | Status: AC | PRN

## 2021-06-12 NOTE — Plan of Care (Signed)
  Problem: Education: Goal: Ability to describe self-care measures that may prevent or decrease complications (Diabetes Survival Skills Education) will improve Outcome: Progressing   Problem: Coping: Goal: Ability to adjust to condition or change in health will improve Outcome: Progressing   Problem: Fluid Volume: Goal: Ability to maintain a balanced intake and output will improve Outcome: Progressing   Problem: Health Behavior/Discharge Planning: Goal: Ability to identify and utilize available resources and services will improve Outcome: Progressing   Problem: Nutritional: Goal: Maintenance of adequate nutrition will improve Outcome: Progressing   Problem: Skin Integrity: Goal: Risk for impaired skin integrity will decrease Outcome: Progressing   

## 2021-06-12 NOTE — Progress Notes (Signed)
Subjective: 2 Days Post-Op Procedure(s) (LRB): TOTAL KNEE ARTHROPLASTY (Right) Patient reports pain as 4 on 0-10 scale.   Denies CP or SOB.  Voiding without difficulty. Positive flatus. Nausea better today Objective: Vital signs in last 24 hours: Temp:  [97.8 F (36.6 C)-98.2 F (36.8 C)] 98.2 F (36.8 C) (06/11 0536) Pulse Rate:  [61-62] 61 (06/11 0536) Resp:  [14-18] 16 (06/11 0536) BP: (123-144)/(68-83) 128/68 (06/11 0536) SpO2:  [94 %-97 %] 94 % (06/11 0536)  Intake/Output from previous day: 06/10 0701 - 06/11 0700 In: 2106.1 [P.O.:840; I.V.:966.1; IV Piggyback:300] Out: 650 [Urine:650] Intake/Output this shift: No intake/output data recorded.  Recent Labs    06/11/21 0349 06/12/21 0307  HGB 13.7 11.9*   Recent Labs    06/11/21 0349 06/12/21 0307  WBC 9.3 10.1  RBC 4.37 3.76*  HCT 40.8 35.5*  PLT 159 158   Recent Labs    06/11/21 0349  NA 139  K 4.5  CL 108  CO2 25  BUN 18  CREATININE 0.84  GLUCOSE 263*  CALCIUM 9.1   No results for input(s): "LABPT", "INR" in the last 72 hours.  Neurologically intact ABD soft Neurovascular intact Sensation intact distally Intact pulses distally Dorsiflexion/Plantar flexion intact Incision: dressing C/D/I  Assessment/Plan:  2 Days Post-Op Procedure(s) (LRB): TOTAL KNEE ARTHROPLASTY (Right) Up with therapy D/C IV fluids Discharge home with home health if nausea resolves Reglan   Principal Problem:   Right knee DJD      Marcus Patterson 06/12/2021, @NOW 

## 2021-06-12 NOTE — Progress Notes (Signed)
Physical Therapy Treatment Patient Details Name: Marcus Patterson MRN: [id number] DOB: Jun 22, 1945 Today's Date: 06/12/2021   History of Present Illness [name] and with hc of DM, CAD, MI, vertigo, PTSD, and agent orange exposure    PT Comments    Dtr present for stair training.  Pt up to hall, negotiated stairs and provided with written instruction.  Pt eager for dc home this date.   Recommendations for follow up therapy are one component of a multi-disciplinary discharge planning process, led by the attending physician.  Recommendations may be updated based on patient status, additional functional criteria and insurance authorization.  Follow Up Recommendations  Follow physician's recommendations for discharge plan and follow up therapies     Assistance Recommended at Discharge Intermittent Supervision/Assistance  Patient can return home with the following A little help with walking and/or transfers;A little help with bathing/dressing/bathroom;Assistance with cooking/housework;Assist for transportation;Help with stairs or ramp for entrance   Equipment Recommendations  Rolling walker (2 wheels)    Recommendations for Other Services       Precautions / Restrictions Precautions Precautions: Knee;Fall Restrictions Weight Bearing Restrictions: No RLE Weight Bearing: Weight bearing as tolerated     Mobility  Bed Mobility Overal bed mobility: Needs Assistance Bed Mobility: Supine to Sit, Sit to Supine     Supine to sit: Supervision Sit to supine: Supervision   General bed mobility comments: Pt assisting R LE with belt    Transfers Overall transfer level: Needs assistance Equipment used: Rolling walker (2 wheels) Transfers: Sit to/from Stand Sit to Stand: Min guard, Supervision           General transfer comment: cues for LE management and use of UEs to self assist    Ambulation/Gait Ambulation/Gait assistance: Min guard, Supervision Gait Distance (Feet): 60  Feet Assistive device: Rolling walker (2 wheels) Gait Pattern/deviations: Step-to pattern, Decreased step length - right, Decreased step length - left, Shuffle, Trunk flexed Gait velocity: decr     General Gait Details: min cues for posture and position from RW   Stairs Stairs: Yes Stairs assistance: Min assist Stair Management: No rails, Step to pattern, Backwards, With walker Number of Stairs: 2 General stair comments: 2 steps bkwd with RW; cues for sequence; pt declines to attempt 2nd time 2* increasing knee pain   Wheelchair Mobility    Modified Rankin (Stroke Patients Only)       Balance Overall balance assessment: Needs assistance Sitting-balance support: Feet supported, No upper extremity supported Sitting balance-Leahy Scale: Good     Standing balance support: No upper extremity supported Standing balance-Leahy Scale: Fair                              Cognition Arousal/Alertness: Awake/alert Behavior During Therapy: WFL for tasks assessed/performed Overall Cognitive Status: Within Functional Limits for tasks assessed                                          Exercises Total Joint Exercises Ankle Circles/Pumps: AROM, Both, 15 reps, Supine Quad Sets: AROM, Both, 10 reps, Supine Heel Slides: AAROM, Right, Supine, 20 reps Straight Leg Raises: AAROM, AROM, Right, Supine, 20 reps    General Comments        Pertinent Vitals/Pain Pain Assessment Pain Assessment: 0-10 Pain Score: 6  Pain Location: R knee Pain Descriptors /  Indicators: Aching, Sore Pain Intervention(s): Limited activity within patient's tolerance, Monitored during session, Premedicated before session, Ice applied    Home Living                          Prior Function            PT Goals (current goals can now be found in the care plan section) Acute Rehab PT Goals Patient Stated Goal: Regain IND PT Goal Formulation: With patient Time For Goal  Achievement: 06/18/21 Potential to Achieve Goals: Good Progress towards PT goals: Progressing toward goals    Frequency    7X/week      PT Plan Current plan remains appropriate    Co-evaluation              AM-PAC PT "6 Clicks" Mobility   Outcome Measure  Help needed turning from your back to your side while in a flat bed without using bedrails?: A Little Help needed moving from lying on your back to sitting on the side of a flat bed without using bedrails?: A Little Help needed moving to and from a bed to a chair (including a wheelchair)?: A Little Help needed standing up from a chair using your arms (e.g., wheelchair or bedside chair)?: A Little Help needed to walk in hospital room?: A Little Help needed climbing 3-5 steps with a railing? : A Little 6 Click Score: 18    End of Session Equipment Utilized During Treatment: Gait belt Activity Tolerance: Patient tolerated treatment well Patient left: with call bell/phone within reach;with family/visitor present;in bed Nurse Communication: Mobility status PT Visit Diagnosis: Difficulty in walking, not elsewhere classified (R26.2)     Time: 1140-1200 PT Time Calculation (min) (ACUTE ONLY): 20 min  Charges:  $Gait Training: 8-22 mins                     Mauro Kaufmann PT Acute Rehabilitation Services Pager [phone] Office [phone]    Merdis Snodgrass 06/12/2021, 1:04 PM

## 2021-06-12 NOTE — Progress Notes (Signed)
Physical Therapy Treatment Patient Details Name: Marcus Patterson MRN: [id number] DOB: 11-Aug-1945 Today's Date: 06/12/2021   History of Present Illness [name] and with hc of DM, CAD, MI, vertigo, PTSD, and agent orange exposure    PT Comments    Pt in good spirits this am and motivated to dc home.  Pt performed therex program with assist, ambulated increased distance in hall and negotiated stairs.  Will review stairs with dtr later this am.   Recommendations for follow up therapy are one component of a multi-disciplinary discharge planning process, led by the attending physician.  Recommendations may be updated based on patient status, additional functional criteria and insurance authorization.  Follow Up Recommendations  Follow physician's recommendations for discharge plan and follow up therapies     Assistance Recommended at Discharge Intermittent Supervision/Assistance  Patient can return home with the following A little help with walking and/or transfers;A little help with bathing/dressing/bathroom;Assistance with cooking/housework;Assist for transportation;Help with stairs or ramp for entrance   Equipment Recommendations  Rolling walker (2 wheels)    Recommendations for Other Services       Precautions / Restrictions Precautions Precautions: Knee;Fall Restrictions Weight Bearing Restrictions: No RLE Weight Bearing: Weight bearing as tolerated     Mobility  Bed Mobility Overal bed mobility: Needs Assistance Bed Mobility: Supine to Sit     Supine to sit: Supervision     General bed mobility comments: Pt assisting R LE with belt    Transfers Overall transfer level: Needs assistance Equipment used: Rolling walker (2 wheels) Transfers: Sit to/from Stand Sit to Stand: Min guard, Supervision           General transfer comment: cues for LE management and use of UEs to self assist    Ambulation/Gait Ambulation/Gait assistance: Min guard,  Supervision Gait Distance (Feet): 100 Feet Assistive device: Rolling walker (2 wheels) Gait Pattern/deviations: Step-to pattern, Decreased step length - right, Decreased step length - left, Shuffle, Trunk flexed Gait velocity: decr     General Gait Details: min cues for posture and position from RW   Stairs Stairs: Yes Stairs assistance: Min assist Stair Management: No rails, Step to pattern, Backwards, With walker Number of Stairs: 4 General stair comments: 2 steps twice bkwd with RW; cues for sequence   Wheelchair Mobility    Modified Rankin (Stroke Patients Only)       Balance Overall balance assessment: Needs assistance Sitting-balance support: Feet supported, No upper extremity supported Sitting balance-Leahy Scale: Good     Standing balance support: No upper extremity supported Standing balance-Leahy Scale: Fair                              Cognition Arousal/Alertness: Awake/alert Behavior During Therapy: WFL for tasks assessed/performed Overall Cognitive Status: Within Functional Limits for tasks assessed                                          Exercises Total Joint Exercises Ankle Circles/Pumps: AROM, Both, 15 reps, Supine Quad Sets: AROM, Both, 10 reps, Supine Heel Slides: AAROM, Right, Supine, 20 reps Straight Leg Raises: AAROM, AROM, Right, Supine, 20 reps    General Comments        Pertinent Vitals/Pain Pain Assessment Pain Assessment: 0-10 Pain Score: 5  Pain Location: R knee Pain Descriptors / Indicators: Aching, Sore Pain  Intervention(s): Limited activity within patient's tolerance, Monitored during session, Premedicated before session, Ice applied    Home Living                          Prior Function            PT Goals (current goals can now be found in the care plan section) Acute Rehab PT Goals Patient Stated Goal: Regain IND PT Goal Formulation: With patient Time For Goal Achievement:  06/18/21 Potential to Achieve Goals: Good Progress towards PT goals: Progressing toward goals    Frequency    7X/week      PT Plan Current plan remains appropriate    Co-evaluation              AM-PAC PT "6 Clicks" Mobility   Outcome Measure  Help needed turning from your back to your side while in a flat bed without using bedrails?: A Little Help needed moving from lying on your back to sitting on the side of a flat bed without using bedrails?: A Little Help needed moving to and from a bed to a chair (including a wheelchair)?: A Little Help needed standing up from a chair using your arms (e.g., wheelchair or bedside chair)?: A Little Help needed to walk in hospital room?: A Little Help needed climbing 3-5 steps with a railing? : A Little 6 Click Score: 18    End of Session Equipment Utilized During Treatment: Gait belt Activity Tolerance: Patient tolerated treatment well Patient left: in chair;with call bell/phone within reach;with chair alarm set;with family/visitor present Nurse Communication: Mobility status PT Visit Diagnosis: Difficulty in walking, not elsewhere classified (R26.2)     Time: 2297-9892 PT Time Calculation (min) (ACUTE ONLY): 34 min  Charges:  $Gait Training: 8-22 mins $Therapeutic Exercise: 8-22 mins                     Marcus Patterson PT Acute Rehabilitation Services Pager [phone] Office [phone]    Marcus Patterson 06/12/2021, 8:46 AM

## 2021-06-12 NOTE — Progress Notes (Signed)
Provided discharge education/instructions, all questions and concerns addressed. Pt not in acute distress, discharged home with belongings accompanied by his daughter.

## 2021-06-13 ENCOUNTER — Encounter (HOSPITAL_COMMUNITY): Payer: Self-pay | Admitting: Specialist

## 2023-07-08 ENCOUNTER — Emergency Department (HOSPITAL_COMMUNITY)

## 2023-07-08 ENCOUNTER — Encounter (HOSPITAL_COMMUNITY): Payer: Self-pay | Admitting: *Deleted

## 2023-07-08 ENCOUNTER — Other Ambulatory Visit: Payer: Self-pay

## 2023-07-08 ENCOUNTER — Emergency Department (HOSPITAL_COMMUNITY)
Admission: EM | Admit: 2023-07-08 | Discharge: 2023-07-08 | Disposition: A | Discharge status: Home / Self Care | Source: Ambulatory Visit | Attending: Emergency Medicine | Admitting: Emergency Medicine

## 2023-07-08 DIAGNOSIS — Z7982 Long term (current) use of aspirin: Secondary | ICD-10-CM | POA: Insufficient documentation

## 2023-07-08 DIAGNOSIS — R197 Diarrhea, unspecified: Secondary | ICD-10-CM | POA: Insufficient documentation

## 2023-07-08 DIAGNOSIS — K4020 Bilateral inguinal hernia, without obstruction or gangrene, not specified as recurrent: Secondary | ICD-10-CM | POA: Diagnosis not present

## 2023-07-08 DIAGNOSIS — Z79899 Other long term (current) drug therapy: Secondary | ICD-10-CM | POA: Insufficient documentation

## 2023-07-08 DIAGNOSIS — E119 Type 2 diabetes mellitus without complications: Secondary | ICD-10-CM | POA: Insufficient documentation

## 2023-07-08 DIAGNOSIS — I10 Essential (primary) hypertension: Secondary | ICD-10-CM | POA: Diagnosis not present

## 2023-07-08 DIAGNOSIS — R109 Unspecified abdominal pain: Secondary | ICD-10-CM

## 2023-07-08 LAB — CBC
HCT: 44.2 % (ref 39.0–52.0)
Hemoglobin: 15.1 g/dL (ref 13.0–17.0)
MCH: 31.7 pg (ref 26.0–34.0)
MCHC: 34.2 g/dL (ref 30.0–36.0)
MCV: 92.7 fL (ref 80.0–100.0)
Platelets: 170 K/uL (ref 150–400)
RBC: 4.77 MIL/uL (ref 4.22–5.81)
RDW: 13.6 % (ref 11.5–15.5)
WBC: 6.6 K/uL (ref 4.0–10.5)
nRBC: 0 % (ref 0.0–0.2)

## 2023-07-08 LAB — URINALYSIS, ROUTINE W REFLEX MICROSCOPIC
Bacteria, UA: NONE SEEN
Bilirubin Urine: NEGATIVE
Glucose, UA: >=500 mg/dL — AB
Hgb urine dipstick: NEGATIVE
Ketones, ur: NEGATIVE mg/dL
Leukocytes,Ua: NEGATIVE
Nitrite: NEGATIVE
Protein, ur: NEGATIVE mg/dL
Specific Gravity, Urine: >1.046 — ABNORMAL HIGH (ref 1.005–1.030)
pH: 5 (ref 5.0–8.0)

## 2023-07-08 LAB — C DIFFICILE QUICK SCREEN W PCR REFLEX
C Diff antigen: NEGATIVE
C Diff interpretation: NOT DETECTED
C Diff toxin: NEGATIVE

## 2023-07-08 LAB — COMPREHENSIVE METABOLIC PANEL WITH GFR
ALT: 20 U/L (ref 0–44)
AST: 24 U/L (ref 15–41)
Albumin: 3.8 g/dL (ref 3.5–5.0)
Alkaline Phosphatase: 88 U/L (ref 38–126)
Anion gap: 10 (ref 5–15)
BUN: 20 mg/dL (ref 8–23)
CO2: 22 mmol/L (ref 22–32)
Calcium: 9 mg/dL (ref 8.9–10.3)
Chloride: 104 mmol/L (ref 98–111)
Creatinine, Ser: 0.79 mg/dL (ref 0.61–1.24)
GFR, Estimated: >60 mL/min (ref >60)
Glucose, Bld: 194 mg/dL — ABNORMAL HIGH (ref 70–99)
Potassium: 4 mmol/L (ref 3.5–5.1)
Sodium: 136 mmol/L (ref 135–145)
Total Bilirubin: 1 mg/dL (ref 0.0–1.2)
Total Protein: 7.2 g/dL (ref 6.5–8.1)

## 2023-07-08 LAB — LIPASE, BLOOD: Lipase: 34 U/L (ref 11–51)

## 2023-07-08 MED ORDER — IOHEXOL 300 MG/ML  SOLN
100.0000 mL | Freq: Once | INTRAMUSCULAR | Status: AC | PRN
Start: 2023-07-08 — End: 2023-07-08
  Administered 2023-07-08: 100 mL via INTRAVENOUS

## 2023-07-08 MED ORDER — SODIUM CHLORIDE 0.9 % IV BOLUS
500.0000 mL | Freq: Once | INTRAVENOUS | Status: AC
  Administered 2023-07-08: 500 mL via INTRAVENOUS

## 2023-07-08 MED ORDER — PANTOPRAZOLE SODIUM 40 MG PO TBEC: 40.0000 mg | DELAYED_RELEASE_TABLET | Freq: Every day | ORAL | 0 refills | Status: AC

## 2023-07-08 MED ORDER — DICYCLOMINE HCL 20 MG PO TABS: 20.0000 mg | ORAL_TABLET | Freq: Two times a day (BID) | ORAL | 0 refills | Status: AC

## 2023-07-08 MED ORDER — DICYCLOMINE HCL 10 MG PO CAPS
20.0000 mg | ORAL_CAPSULE | Freq: Once | ORAL | Status: AC
Start: 2023-07-08 — End: 2023-07-08
  Administered 2023-07-08: 20 mg via ORAL
  Filled 2023-07-08: qty 2

## 2023-07-08 NOTE — ED Triage Notes (Signed)
 Pt with abd cramping x 3-4 weeks.  Lost about 20 lbs.  + diarrhea, denies any N/V Decrease in appetite

## 2023-07-08 NOTE — Discharge Instructions (Addendum)
 You are seen in the ER today for her abdominal cramping and diarrhea with weight loss.  Fortunately your CT scan did not show any serious problem.  You incidentally have hernias in your groin on both sides.  Fortunately there is no pain here and that the hernias are able to be pushed back in.  You should follow-up with a surgeon for evaluation to see if they want to repair these electively.  If you have severe pain, color change or vomiting come back to the ER.  Your CT scan also showed a small incidental pulmonary nodule.  You can follow-up with your PCP for this.  Need to follow-up with a GI doctor regarding your frequent diarrhea and weight loss.  I prescribed Bentyl  which can help with some of the stomach cramping, and hope to hear.  I also starting you on pantoprazole  which is an antacid medicine, sometimes stomach ulcers or inflammation can cause the early fullness you are feeling, and this would help treat that.  Back to the ER for fever, persistent vomiting, dizziness or any other worrisome changes.  Drink plenty of fluids.

## 2023-07-08 NOTE — ED Provider Notes (Signed)
 [place] EMERGENCY DEPARTMENT AT [facility] Provider Note   CSN: [id number] Arrival date & time: 07/08/23  1328     Patient presents with: Abdominal Pain   Marcus Patterson is a 78 y.o. male.  PMH of diabetes, hypertension, heart disease.  He presents the ER today for evaluation of a 20 pound weight loss over the past month, also having early satiety and frequent diarrhea, he has abdominal cramping before having diarrhea and then the pain goes away.  No other abdominal pain, no urinary symptoms, no fevers or chills, no GI bleeding.  Denies recent antibiotics.  He states he called his doctor at the [place] who advised evaluation in the ER.     Abdominal Pain      Prior to Admission medications   Medication Sig Start Date End Date Taking? Authorizing Provider  dicyclomine  (BENTYL ) 20 MG tablet Take 1 tablet (20 mg total) by mouth 2 (two) times daily. 07/08/23  Yes Story Conti A, PA-C  pantoprazole  (PROTONIX ) 40 MG tablet Take 1 tablet (40 mg total) by mouth daily. 07/08/23  Yes Plummer Matich A, PA-C  amLODipine  (NORVASC ) 2.5 MG tablet Take 2.5 mg by mouth daily. 06/16/20   [provider]  aspirin  EC 81 MG tablet Take 1 tablet (81 mg total) by mouth 2 (two) times daily after a meal. Day after surgery 06/10/21   Duwayne Purchase, MD  benzonatate  (TESSALON ) 100 MG capsule Take 1-2 capsules (100-200 mg total) by mouth 3 (three) times daily as needed for cough. Patient not taking: Reported on 05/27/2021 03/10/21   Christopher Savannah, PA-C  cholecalciferol (VITAMIN D) 1000 units tablet Take 1,000 Units by mouth daily.    [provider]  docusate sodium  (COLACE) 100 MG capsule Take 1 capsule (100 mg total) by mouth 2 (two) times daily as needed for mild constipation. 06/10/21   Duwayne Purchase, MD  empagliflozin  (JARDIANCE ) 25 MG TABS tablet Take 12.5 mg by mouth daily. 08/11/20   [provider]  EPINEPHrine  0.3 mg/0.3 mL IJ SOAJ injection Inject 0.3 mg into the muscle as  needed for anaphylaxis. Patient not taking: Reported on 05/27/2021 07/18/20   Roselyn Carlin NOVAK, MD  HYDROcodone -acetaminophen  (NORCO/VICODIN) 5-325 MG tablet Take 1-2 tablets by mouth every 4 (four) hours as needed for moderate pain (4-6 pain). 06/12/21   Duwayne Purchase, MD  hydrOXYzine  (ATARAX /VISTARIL ) 25 MG tablet Take 1 tablet (25 mg total) by mouth every 6 (six) hours as needed for itching. Patient not taking: Reported on 05/27/2021 07/22/17   Long, Joshua G, MD  ipratropium (ATROVENT ) 0.03 % nasal spray Place 2 sprays into both nostrils 2 (two) times daily. Patient not taking: Reported on 05/27/2021 03/10/21   Christopher Savannah, PA-C  levocetirizine (XYZAL ) 5 MG tablet Take 1 tablet (5 mg total) by mouth every evening. Patient not taking: Reported on 05/27/2021 03/10/21   Christopher Savannah, PA-C  meclizine  (ANTIVERT ) 25 MG tablet Take 1 tablet (25 mg total) by mouth 3 (three) times daily as needed for dizziness. Patient not taking: Reported on 05/27/2021 03/27/18   Joyice Sauer, DO  metFORMIN (GLUCOPHAGE-XR) 500 MG 24 hr tablet Take 1,000 mg by mouth 2 (two) times daily.    [provider]  metoCLOPramide  (REGLAN ) 10 MG tablet Take 1 tablet (10 mg total) by mouth every 6 (six) hours as needed for nausea. 06/12/21   Duwayne Purchase, MD  metoprolol  succinate (TOPROL -XL) 50 MG 24 hr tablet Take 25 mg by mouth daily. Take with or immediately following  a meal.    [provider]  nitroGLYCERIN  (NITROSTAT ) 0.4 MG SL tablet Place 1 tablet (0.4 mg total) under the tongue every 5 (five) minutes as needed for chest pain. 10/06/16   Knapp, Iva, MD  Omega-3 Fatty Acids (FISH OIL) 1000 MG CAPS Take 2,000 mg by mouth in the morning, at noon, and at bedtime.    [provider]  ondansetron  (ZOFRAN  ODT) 4 MG disintegrating tablet Take 1 tablet (4 mg total) by mouth every 8 (eight) hours as needed for nausea or vomiting. Patient not taking: Reported on 05/27/2021 03/27/18   Joyice Sauer, DO   ondansetron  (ZOFRAN ) 4 MG tablet Take 1 tablet (4 mg total) by mouth every 6 (six) hours as needed for nausea. 06/12/21   Duwayne Purchase, MD  polyethylene glycol (MIRALAX  / GLYCOLAX ) 17 g packet Take 17 g by mouth daily. 06/10/21   Duwayne Purchase, MD  polyethylene glycol (MIRALAX  / GLYCOLAX ) packet Take 17 g by mouth daily as needed for moderate constipation.    [provider]  predniSONE  (STERAPRED UNI-PAK 21 TAB) 10 MG (21) TBPK tablet 10mg  Tabs, 6 day taper. Use as directed Patient not taking: Reported on 05/27/2021 07/18/20   Roselyn Carlin NOVAK, MD  promethazine -dextromethorphan (PROMETHAZINE -DM) 6.25-15 MG/5ML syrup Take 5 mLs by mouth at bedtime as needed for cough. Patient not taking: Reported on 05/27/2021 03/10/21   Christopher Savannah, PA-C    Allergies: Alogliptin and Lisinopril     Review of Systems  Gastrointestinal:  Positive for abdominal pain.    Updated Vital Signs BP 120/79   Pulse 63   Temp 97.6 F (36.4 C) (Oral)   Resp 18   Ht 5' 8 (1.727 m)   Wt 76.2 kg   SpO2 98%   BMI 25.54 kg/m   Physical Exam Vitals and nursing note reviewed.  Constitutional:      General: He is not in acute distress.    Appearance: He is well-developed.  HENT:     Head: Normocephalic and atraumatic.  Eyes:     Conjunctiva/sclera: Conjunctivae normal.  Cardiovascular:     Rate and Rhythm: Normal rate and regular rhythm.     Heart sounds: No murmur heard. Pulmonary:     Effort: Pulmonary effort is normal. No respiratory distress.     Breath sounds: Normal breath sounds.  Abdominal:     Palpations: Abdomen is soft.     Tenderness: There is no abdominal tenderness.     Comments: Bilateral inguinal hernias that are not discolored, nontender, and easily reducible.  Musculoskeletal:        General: No swelling.     Cervical back: Neck supple.  Skin:    General: Skin is warm and dry.     Capillary Refill: Capillary refill takes less than 2 seconds.  Neurological:     Mental Status:  He is alert.  Psychiatric:        Mood and Affect: Mood normal.     (all labs ordered are listed, but only abnormal results are displayed) Labs Reviewed  COMPREHENSIVE METABOLIC PANEL WITH GFR - Abnormal; Notable for the following components:      Result Value   Glucose, Bld 194 (*)    All other components within normal limits  URINALYSIS, ROUTINE W REFLEX MICROSCOPIC - Abnormal; Notable for the following components:   Color, Urine STRAW (*)    Specific Gravity, Urine >1.046 (*)    Glucose, UA >=500 (*)    All other components within normal limits  C DIFFICILE QUICK SCREEN W PCR REFLEX    GASTROINTESTINAL PANEL BY PCR, STOOL (REPLACES STOOL CULTURE)  LIPASE, BLOOD  CBC    EKG: None  Radiology: CT CHEST ABDOMEN PELVIS W CONTRAST Result Date: 07/08/2023 CLINICAL DATA:  Unintentional weight loss. Abdominal cramping for 3-4 weeks. Diarrhea. Decreased appetite. EXAM: CT CHEST, ABDOMEN, AND PELVIS WITH CONTRAST TECHNIQUE: Multidetector CT imaging of the chest, abdomen and pelvis was performed following the standard protocol during bolus administration of intravenous contrast. RADIATION DOSE REDUCTION: This exam was performed according to the departmental dose-optimization program which includes automated exposure control, adjustment of the mA and/or kV according to patient size and/or use of iterative reconstruction technique. CONTRAST:  OMNIPAQUE  IOHEXOL  300 MG/ML  SOLN COMPARISON:  09/25/2017. FINDINGS: CT CHEST FINDINGS Cardiovascular: The heart is normal in size and there is a trace pericardial effusion. Multi-vessel coronary artery calcifications are noted. Pacemaker leads are noted in the heart. There is atherosclerotic calcification the aorta without evidence of aneurysm. The pulmonary trunk is mildly distended which may be associated with underlying pulmonary artery hypertension. Mediastinum/Nodes: Enlarged lymph node is present in the subcarinal space measuring 1.3 cm. No axillary  or hilar lymphadenopathy. The thyroid gland, trachea, and esophagus are within normal limits. Lungs/Pleura: Atelectasis is noted bilaterally. No effusion or pneumothorax is seen. A 3 mm nodule is noted in the left lower lobe, axial image 90. Musculoskeletal: A pacemaker device is noted in the anterior chest wall on the left. Degenerative changes are present in the thoracic spine. No acute osseous abnormality is seen. CT ABDOMEN PELVIS FINDINGS Hepatobiliary: No focal liver abnormality is seen. Fatty infiltration of the liver is noted. A stone is seen in the gallbladder. No biliary ductal dilatation. Pancreas: Unremarkable. No pancreatic ductal dilatation or surrounding inflammatory changes. Spleen: Normal in size without focal abnormality. Adrenals/Urinary Tract: No adrenal nodule or mass. The kidneys enhance symmetrically. Parapelvic cysts are present bilaterally. No renal calculus or hydronephrosis bilaterally. There is mild herniation of the anterior aspect of the bladder on the right into a right inguinal hernia. Stomach/Bowel: Stomach is within normal limits. Appendix appears normal. No obstruction, free air or pneumatosis is seen. A few scattered diverticular present along the colon without evidence of diverticulitis. There is herniation of small bowel into a right inguinal hernia without evidence of obstruction. There is herniation of colon into a left inguinal hernia with mild adjacent fat stranding. Vascular/Lymphatic: Aortic atherosclerosis. No enlarged abdominal or pelvic lymph nodes. Reproductive: Prostate gland is mildly enlarged. Other: No abdominopelvic ascites. A fat containing umbilical hernia is present. Musculoskeletal: Degenerative changes are noted in the lumbar spine. No acute or suspicious osseous abnormality is seen. IMPRESSION: 1. Left inguinal hernia containing nonobstructed colon. Mild fat stranding is noted adjacent to the: In the hernia pouch, possible local inflammatory changes. 2. 3 mm  left lower lobe pulmonary nodule. No follow-up needed if patient is low-risk.This recommendation follows the consensus statement: Guidelines for Management of Incidental Pulmonary Nodules Detected on CT Images: From the Fleischner Society 2017; Radiology 2017; 284:228-243. 3. Cholelithiasis. 4. Hepatic steatosis. 5. Right inguinal hernia containing nonobstructed small bowel and a portion of the urinary bladder on the right. 6. Enlarged prostate gland. 7. Coronary artery calcifications and aortic atherosclerosis. Electronically Signed   By: Leita Birmingham M.D.   On: 07/08/2023 15:35     Procedures   Medications Ordered in the ED  iohexol  (OMNIPAQUE ) 300 MG/ML solution 100 mL (100 mLs Intravenous Contrast Given 07/08/23 1509)  dicyclomine  (BENTYL ) capsule  20 mg (20 mg Oral Given 07/08/23 1603)  sodium chloride  0.9 % bolus 500 mL (0 mLs Intravenous Stopped 07/08/23 1802)                                    Medical Decision Making This patient presents to the ED for concern of abdominal pain, diarrhea and weight loss, this involves an extensive number of treatment options, and is a complaint that carries with it a high risk of complications and morbidity.  The differential diagnosis includes gastritis, PUD gastroenteritis, colitis, malignancy, other   Co morbidities that complicate the patient evaluation  Hypertension, diabetes   Additional history obtained:  Additional history obtained from EMR External records from outside source obtained and reviewed including prior notes and labs   Lab Tests:  I Ordered, and personally interpreted labs.  The pertinent results include: UA shows high specific gravity and glucose.  Otherwise normal, CBC normal, lipase normal, CMP with glucose 194 otherwise normal, C. difficile is negative   Imaging Studies ordered:  I ordered imaging studies including CT chest abdomen pelvis I independently visualized and interpreted imaging which showed bilateral inguinal  hernias with bowel containing but no obstruction, small amount of fat stranding and hernia sac right; 3 mm pulm nodule left lower lobe gallstones without cholecystitis coronary artery calcifications aortic atherosclerosis with I agree with the radiologist interpretation     Problem List / ED Course / Critical interventions / Medication management  Diarrhea, abdominal cramping and weight loss-given patient's age, concern for possible leniency, that was his primary concern and ordered CT chest of the pelvis no malignancy, he does have his bilateral hernias that are easily reducible on exam with no signs of obstruction, no infection, no tenderness or incarceration.  Advised on follow-up with surgery for possible repair if indicated.  Regarding the diarrhea, C. difficile was negative, GI panel ordered but patient advised to follow-up on MyChart, instructed on sign up for this and how to follow-up with his PCP for this.  We discussed his CT findings at length including all of his various subtle findings.  He is going to call his PCP at the [facility] clinic tomorrow to schedule close outpatient follow-up.  Discussed all these findings and also he is advised to have them refer him to GI for close outpatient follow-up, we discussed with his early satiety and weight loss he would likely need further testing such as endoscopy and/or colonoscopy.  Will start him on a PPI for the time being for the early satiety to see if this helps.  He also had significant improvement of his abdominal cramping with Bentyl , will give a short course, will avoid long-term due to age and risk of anticholinergic effects. I ordered medication including Bentyl  for abdominal cramping Reevaluation of the patient after these medicines showed that the patient resolved I have reviewed the patients home medicines and have made adjustments as needed   Consider need for admission-patient does not meet admission criteria, he will be discharged home in  stable condition.  Amount and/or Complexity of Data Reviewed Labs: ordered. Radiology: ordered.  Risk Prescription drug management.        Final diagnoses:  Abdominal pain, unspecified abdominal location  Diarrhea, unspecified type  Bilateral inguinal hernia without obstruction or gangrene, recurrence not specified    ED Discharge Orders          Ordered    pantoprazole  (PROTONIX )  40 MG tablet  Daily        07/08/23 1800    dicyclomine  (BENTYL ) 20 MG tablet  2 times daily        07/08/23 [address] 07/08/23 2202    Suzette Pac, MD 07/11/23 [phone]

## 2023-07-09 ENCOUNTER — Telehealth (HOSPITAL_COMMUNITY): Payer: Self-pay | Admitting: Physician Assistant

## 2023-07-09 DIAGNOSIS — A044 Other intestinal Escherichia coli infections: Secondary | ICD-10-CM

## 2023-07-09 LAB — GASTROINTESTINAL PANEL BY PCR, STOOL (REPLACES STOOL CULTURE)

## 2023-07-09 MED ORDER — AZITHROMYCIN 500 MG PO TABS
500.0000 mg | ORAL_TABLET | Freq: Every day | ORAL | 0 refills | Status: AC
Start: 2023-07-09 — End: 2023-07-12

## 2023-07-09 NOTE — Telephone Encounter (Cosign Needed)
 Patient to inform him that his GI panel was positive for enteropathic E. coli.  Given his 4 weeks of symptoms and 20 pounds weight loss feels appropriate to treat him.  Will send in 3 days of azithromycin .  He is following up with the [facility] clinic tomorrow.  Since he states his [place] doctor will not be able to get these results, and he does not have access to MyChart, he would like copy sent to his phone via text message and consents to this.

## 2023-07-09 NOTE — ED Notes (Signed)
 Received call from [facility] lab informing that pt GI panel came back positive for EPEC- EDP-Dr Patsey made aware

## 2023-11-23 IMAGING — DX DG KNEE 1-2V*R*
2 series · 2 of 2 positions shown · non-contrast
Comparison: None Available.

CLINICAL DATA: Status post total knee arthroplasty using cement

EXAM:
RIGHT KNEE - 1-2 VIEW

[knee ap]
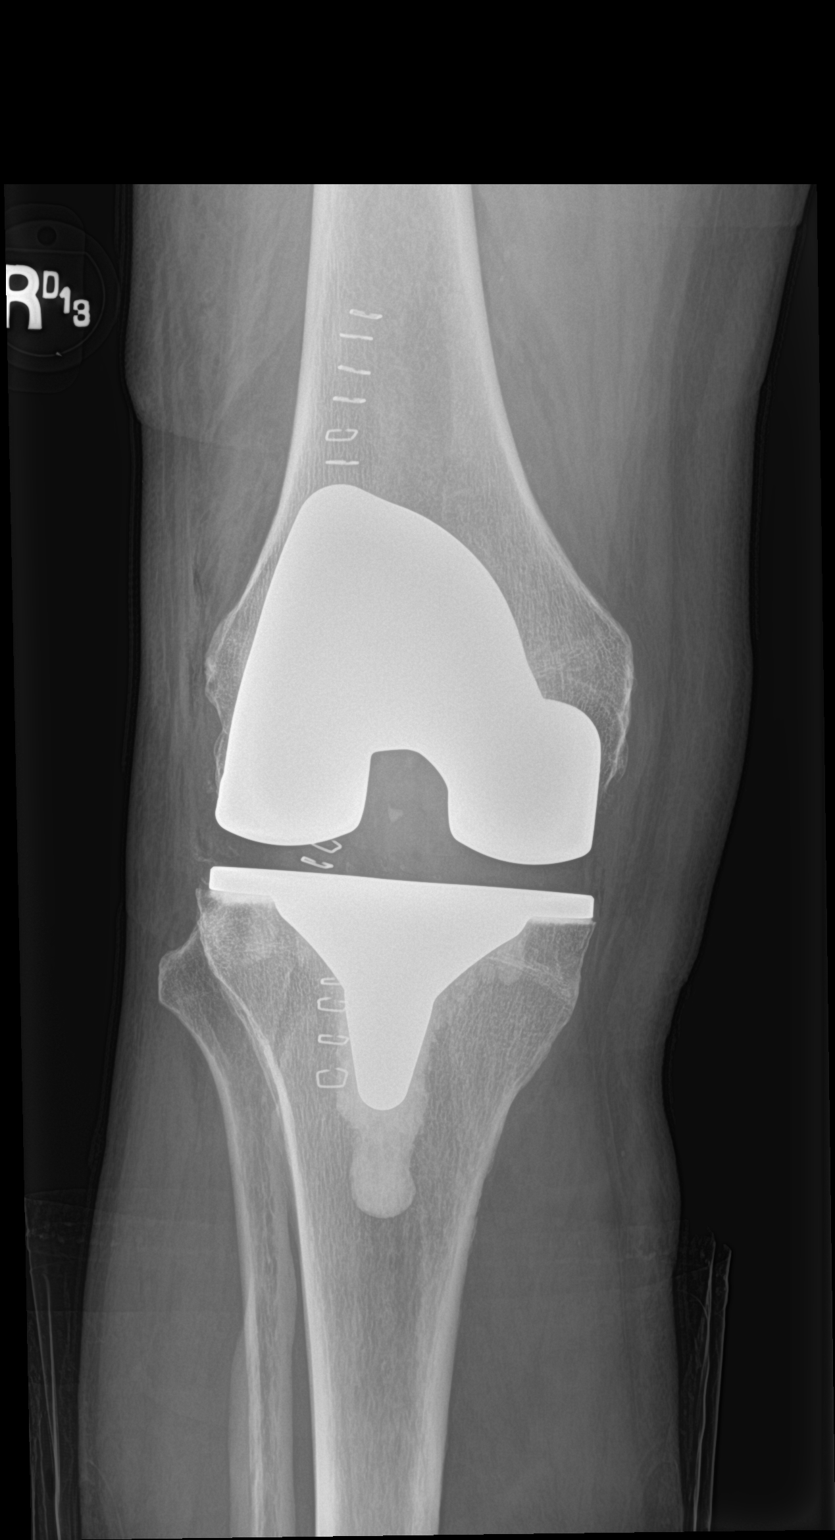

[knee lat]
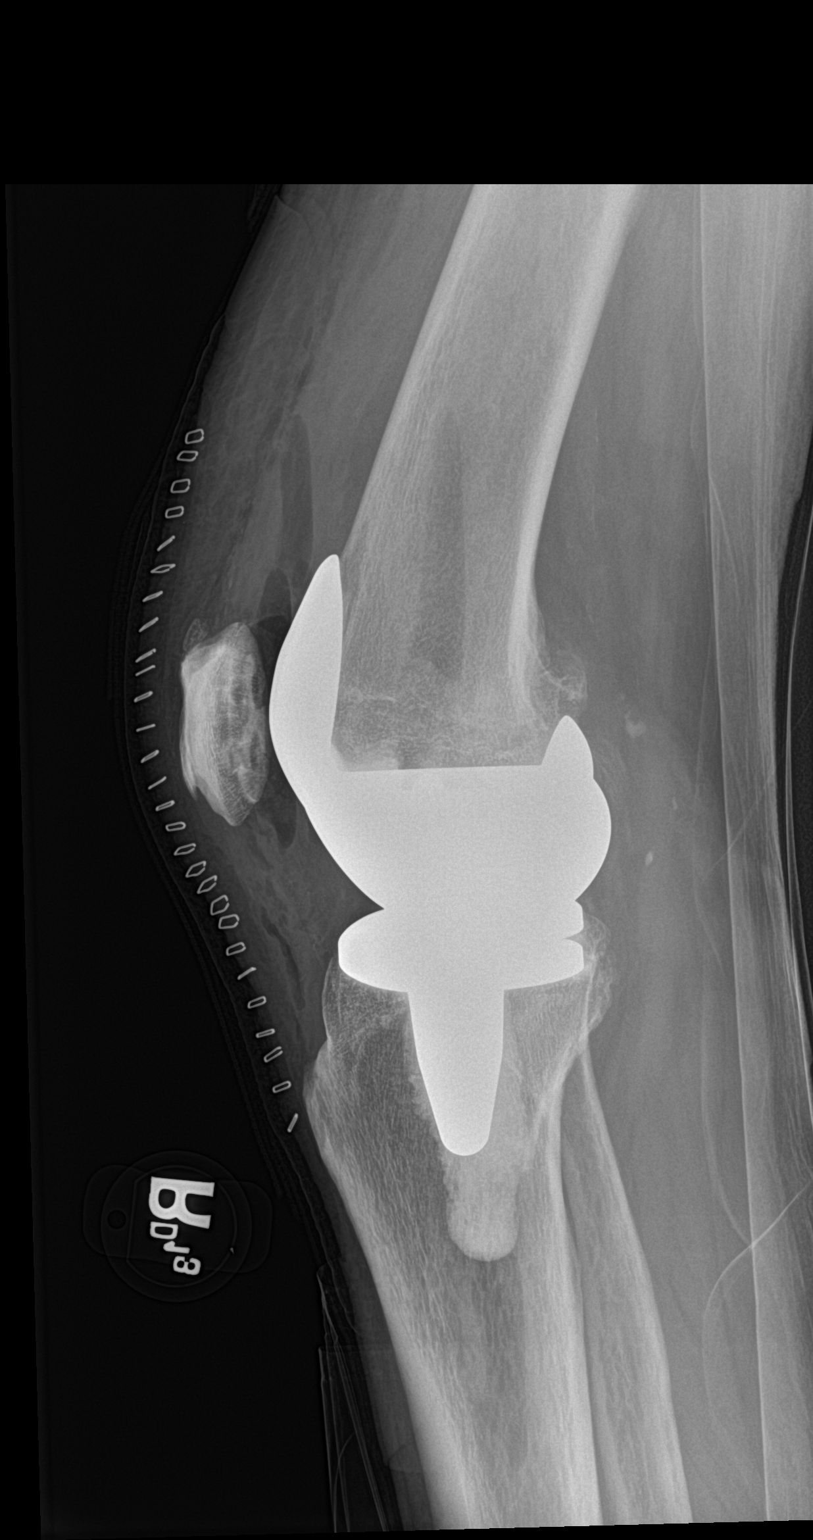

[2 of 2 positions shown; findings below may reference images not displayed]

FINDINGS: Status post total right knee arthroplasty. No perihardware lucency
is seen to indicate hardware failure or loosening. Expected
postoperative changes including moderate joint effusion,
intra-articular air, and mild anterior soft tissue swelling. There
are anterior surgical skin staples. No acute fracture or
dislocation.
IMPRESSION: Status post total right knee arthroplasty without evidence of
hardware failure.

## 2023-11-24 ENCOUNTER — Other Ambulatory Visit: Payer: Self-pay

## 2023-11-24 ENCOUNTER — Emergency Department (HOSPITAL_COMMUNITY)
Admission: EM | Admit: 2023-11-24 | Discharge: 2023-11-24 | Disposition: A | Discharge status: Home / Self Care | Attending: Emergency Medicine | Admitting: Emergency Medicine

## 2023-11-24 ENCOUNTER — Emergency Department (HOSPITAL_COMMUNITY)

## 2023-11-24 DIAGNOSIS — E119 Type 2 diabetes mellitus without complications: Secondary | ICD-10-CM | POA: Diagnosis not present

## 2023-11-24 DIAGNOSIS — R410 Disorientation, unspecified: Secondary | ICD-10-CM | POA: Insufficient documentation

## 2023-11-24 DIAGNOSIS — R42 Dizziness and giddiness: Secondary | ICD-10-CM | POA: Diagnosis not present

## 2023-11-24 DIAGNOSIS — I10 Essential (primary) hypertension: Secondary | ICD-10-CM | POA: Insufficient documentation

## 2023-11-24 DIAGNOSIS — R110 Nausea: Secondary | ICD-10-CM | POA: Diagnosis present

## 2023-11-24 DIAGNOSIS — Z7982 Long term (current) use of aspirin: Secondary | ICD-10-CM | POA: Diagnosis not present

## 2023-11-24 DIAGNOSIS — N3000 Acute cystitis without hematuria: Secondary | ICD-10-CM | POA: Diagnosis not present

## 2023-11-24 DIAGNOSIS — Z79899 Other long term (current) drug therapy: Secondary | ICD-10-CM | POA: Diagnosis not present

## 2023-11-24 DIAGNOSIS — Z96659 Presence of unspecified artificial knee joint: Secondary | ICD-10-CM | POA: Diagnosis not present

## 2023-11-24 DIAGNOSIS — Z7984 Long term (current) use of oral hypoglycemic drugs: Secondary | ICD-10-CM | POA: Diagnosis not present

## 2023-11-24 LAB — CBC WITH DIFFERENTIAL/PLATELET
Abs Immature Granulocytes: 0.03 K/uL (ref 0.00–0.07)
Basophils Absolute: 0 K/uL (ref 0.0–0.1)
Basophils Relative: 0 %
Eosinophils Absolute: 0 K/uL (ref 0.0–0.5)
Eosinophils Relative: 0 %
HCT: 45.2 % (ref 39.0–52.0)
Hemoglobin: 15.3 g/dL (ref 13.0–17.0)
Immature Granulocytes: 0 %
Lymphocytes Relative: 6 %
Lymphs Abs: 0.6 K/uL — ABNORMAL LOW (ref 0.7–4.0)
MCH: 31.3 pg (ref 26.0–34.0)
MCHC: 33.8 g/dL (ref 30.0–36.0)
MCV: 92.4 fL (ref 80.0–100.0)
Monocytes Absolute: 1 K/uL (ref 0.1–1.0)
Monocytes Relative: 9 %
Neutro Abs: 9.4 K/uL — ABNORMAL HIGH (ref 1.7–7.7)
Neutrophils Relative %: 85 %
Platelets: 156 K/uL (ref 150–400)
RBC: 4.89 MIL/uL (ref 4.22–5.81)
RDW: 13.3 % (ref 11.5–15.5)
WBC: 11.1 K/uL — ABNORMAL HIGH (ref 4.0–10.5)
nRBC: 0 % (ref 0.0–0.2)

## 2023-11-24 LAB — URINALYSIS, W/ REFLEX TO CULTURE (INFECTION SUSPECTED)
Bilirubin Urine: NEGATIVE
Glucose, UA: NEGATIVE mg/dL
Ketones, ur: NEGATIVE mg/dL
Nitrite: NEGATIVE
Protein, ur: 100 mg/dL — AB
Specific Gravity, Urine: 1.024 (ref 1.005–1.030)
WBC, UA: >50 WBC/hpf (ref 0–5)
pH: 5 (ref 5.0–8.0)

## 2023-11-24 LAB — COMPREHENSIVE METABOLIC PANEL WITH GFR
ALT: 26 U/L (ref 0–44)
AST: 25 U/L (ref 15–41)
Albumin: 3.9 g/dL (ref 3.5–5.0)
Alkaline Phosphatase: 89 U/L (ref 38–126)
Anion gap: 10 (ref 5–15)
BUN: 19 mg/dL (ref 8–23)
CO2: 24 mmol/L (ref 22–32)
Calcium: 8.6 mg/dL — ABNORMAL LOW (ref 8.9–10.3)
Chloride: 104 mmol/L (ref 98–111)
Creatinine, Ser: 0.74 mg/dL (ref 0.61–1.24)
GFR, Estimated: >60 mL/min (ref >60)
Glucose, Bld: 115 mg/dL — ABNORMAL HIGH (ref 70–99)
Potassium: 4 mmol/L (ref 3.5–5.1)
Sodium: 138 mmol/L (ref 135–145)
Total Bilirubin: 1 mg/dL (ref 0.0–1.2)
Total Protein: 6.7 g/dL (ref 6.5–8.1)

## 2023-11-24 LAB — RESP PANEL BY RT-PCR (RSV, FLU A&B, COVID)  RVPGX2
Influenza A by PCR: NEGATIVE
Influenza B by PCR: NEGATIVE
Resp Syncytial Virus by PCR: NEGATIVE
SARS Coronavirus 2 by RT PCR: NEGATIVE

## 2023-11-24 MED ORDER — SODIUM CHLORIDE 0.9 % IV SOLN
1.0000 g | Freq: Once | INTRAVENOUS | Status: AC
  Administered 2023-11-24: 1 g via INTRAVENOUS
  Filled 2023-11-24: qty 10

## 2023-11-24 MED ORDER — ACETAMINOPHEN 500 MG PO TABS
1000.0000 mg | ORAL_TABLET | Freq: Once | ORAL | Status: AC
Start: 2023-11-24 — End: 2023-11-24
  Administered 2023-11-24: 1000 mg via ORAL
  Filled 2023-11-24: qty 2

## 2023-11-24 MED ORDER — ONDANSETRON HCL 4 MG/2ML IJ SOLN
4.0000 mg | Freq: Once | INTRAMUSCULAR | Status: AC
  Administered 2023-11-24: 4 mg via INTRAVENOUS
  Filled 2023-11-24: qty 2

## 2023-11-24 MED ORDER — CEPHALEXIN 500 MG PO CAPS: 500.0000 mg | ORAL_CAPSULE | Freq: Three times a day (TID) | ORAL | 0 refills | Status: DC

## 2023-11-24 MED ORDER — CEPHALEXIN 500 MG PO CAPS: 500.0000 mg | ORAL_CAPSULE | Freq: Three times a day (TID) | ORAL | 0 refills | Status: AC

## 2023-11-24 NOTE — ED Triage Notes (Signed)
 Pt arrived via [facility]. Pt called EMS due to getting dizzy and shaking when standing. Pt stated he started to get headache when this all happened. Pt denies blurred vision but has nausea without vomiting. Pt A and O x4. Pt has good strength in both hands. Pt states he becomes dizzy repeatedly during triage and during the spells of dizziness the hand tremors increase.

## 2023-11-24 NOTE — Discharge Instructions (Addendum)
 For the fever please alternate ibuprofen  and Tylenol , every 4 hours, maximum dose of Tylenol  will be 1000 mg, maximum dose of ibuprofen  600 mg, alternate every 4 hours.  You do have a bladder infection, take cephalexin  3 times a day for 7 days.  You will need to follow-up with your family doctor within 3 days if you are not doing any better but if things get worse please come back to the hospital.

## 2023-11-24 NOTE — ED Provider Notes (Signed)
 [place] EMERGENCY DEPARTMENT AT [facility] Provider Note   CSN: [id number] Arrival date & time: 11/24/23  1121     Patient presents with: Dizziness   Marcus Patterson is a 78 y.o. male.    Dizziness  This patient is a 78 year old male with a history of hypertension, type 2 diabetes, total knee arthroplasty a couple of years ago, history of dizziness for which she was seen about 5 years ago and vertigo as well.  He presents to the hospital today with a complaint of some generalized confusion which is mild and noted by his granddaughter who works here and talk to him this morning when he could not remember the exact month.  The patient reports he was sitting and when he tried to stand up from sitting he became acutely dizzy described as the room spinning felt nauseated and shaky.  This gets better when he holds perfectly still.  He denies coughing nausea vomiting diarrhea swelling of the legs rashes tick bites exposures to people who have been sick, stiff neck or significant headache.    Prior to Admission medications   Medication Sig Start Date End Date Taking? Authorizing Provider  cephALEXin  (KEFLEX ) 500 MG capsule Take 1 capsule (500 mg total) by mouth 3 (three) times daily for 7 days. 11/24/23 12/01/23 Yes Cleotilde Rogue, MD  amLODipine  (NORVASC ) 2.5 MG tablet Take 2.5 mg by mouth daily. 06/16/20   [provider]  aspirin  EC 81 MG tablet Take 1 tablet (81 mg total) by mouth 2 (two) times daily after a meal. Day after surgery 06/10/21   Duwayne Purchase, MD  benzonatate  (TESSALON ) 100 MG capsule Take 1-2 capsules (100-200 mg total) by mouth 3 (three) times daily as needed for cough. Patient not taking: Reported on 05/27/2021 03/10/21   Christopher Savannah, PA-C  cholecalciferol (VITAMIN D) 1000 units tablet Take 1,000 Units by mouth daily.    [provider]  dicyclomine  (BENTYL ) 20 MG tablet Take 1 tablet (20 mg total) by mouth 2 (two) times daily. 07/08/23   Suellen Cantor A, PA-C  docusate sodium  (COLACE) 100 MG capsule Take 1 capsule (100 mg total) by mouth 2 (two) times daily as needed for mild constipation. 06/10/21   Duwayne Purchase, MD  empagliflozin  (JARDIANCE ) 25 MG TABS tablet Take 12.5 mg by mouth daily. 08/11/20   [provider]  EPINEPHrine  0.3 mg/0.3 mL IJ SOAJ injection Inject 0.3 mg into the muscle as needed for anaphylaxis. Patient not taking: Reported on 05/27/2021 07/18/20   Roselyn Carlin NOVAK, MD  HYDROcodone -acetaminophen  (NORCO/VICODIN) 5-325 MG tablet Take 1-2 tablets by mouth every 4 (four) hours as needed for moderate pain (4-6 pain). 06/12/21   Duwayne Purchase, MD  hydrOXYzine  (ATARAX /VISTARIL ) 25 MG tablet Take 1 tablet (25 mg total) by mouth every 6 (six) hours as needed for itching. Patient not taking: Reported on 05/27/2021 07/22/17   Long, Joshua G, MD  ipratropium (ATROVENT ) 0.03 % nasal spray Place 2 sprays into both nostrils 2 (two) times daily. Patient not taking: Reported on 05/27/2021 03/10/21   Christopher Savannah, PA-C  levocetirizine (XYZAL ) 5 MG tablet Take 1 tablet (5 mg total) by mouth every evening. Patient not taking: Reported on 05/27/2021 03/10/21   Christopher Savannah, PA-C  meclizine  (ANTIVERT ) 25 MG tablet Take 1 tablet (25 mg total) by mouth 3 (three) times daily as needed for dizziness. Patient not taking: Reported on 05/27/2021 03/27/18   Joyice Sauer, DO  metFORMIN (GLUCOPHAGE-XR) 500 MG 24 hr tablet Take  1,000 mg by mouth 2 (two) times daily.    [provider]  metoCLOPramide  (REGLAN ) 10 MG tablet Take 1 tablet (10 mg total) by mouth every 6 (six) hours as needed for nausea. 06/12/21   Duwayne Purchase, MD  metoprolol  succinate (TOPROL -XL) 50 MG 24 hr tablet Take 25 mg by mouth daily. Take with or immediately following a meal.    [provider]  nitroGLYCERIN  (NITROSTAT ) 0.4 MG SL tablet Place 1 tablet (0.4 mg total) under the tongue every 5 (five) minutes as needed for chest pain. 10/06/16   Knapp, Iva, MD   Omega-3 Fatty Acids (FISH OIL) 1000 MG CAPS Take 2,000 mg by mouth in the morning, at noon, and at bedtime.    [provider]  ondansetron  (ZOFRAN  ODT) 4 MG disintegrating tablet Take 1 tablet (4 mg total) by mouth every 8 (eight) hours as needed for nausea or vomiting. Patient not taking: Reported on 05/27/2021 03/27/18   Joyice Sauer, DO  ondansetron  (ZOFRAN ) 4 MG tablet Take 1 tablet (4 mg total) by mouth every 6 (six) hours as needed for nausea. 06/12/21   Duwayne Purchase, MD  pantoprazole  (PROTONIX ) 40 MG tablet Take 1 tablet (40 mg total) by mouth daily. 07/08/23   Suellen Cantor A, PA-C  polyethylene glycol (MIRALAX  / GLYCOLAX ) 17 g packet Take 17 g by mouth daily. 06/10/21   Duwayne Purchase, MD  polyethylene glycol (MIRALAX  / GLYCOLAX ) packet Take 17 g by mouth daily as needed for moderate constipation.    [provider]  predniSONE  (STERAPRED UNI-PAK 21 TAB) 10 MG (21) TBPK tablet 10mg  Tabs, 6 day taper. Use as directed Patient not taking: Reported on 05/27/2021 07/18/20   Roselyn Carlin NOVAK, MD  promethazine -dextromethorphan (PROMETHAZINE -DM) 6.25-15 MG/5ML syrup Take 5 mLs by mouth at bedtime as needed for cough. Patient not taking: Reported on 05/27/2021 03/10/21   Christopher Savannah, PA-C    Allergies: Alogliptin and Lisinopril     Review of Systems  Neurological:  Positive for dizziness.  All other systems reviewed and are negative.   Updated Vital Signs BP 106/78   Pulse 74   Temp (!) 100.9 F (38.3 C) (Oral)   Resp 16   Ht 1.727 m (5' 8)   Wt 76.2 kg   SpO2 93%   BMI 25.54 kg/m   Physical Exam Vitals and nursing note reviewed.  Constitutional:      General: He is not in acute distress.    Appearance: He is well-developed.  HENT:     Head: Normocephalic and atraumatic.     Mouth/Throat:     Pharynx: No oropharyngeal exudate.  Eyes:     General: No scleral icterus.       Right eye: No discharge.        Left eye: No discharge.     Conjunctiva/sclera:  Conjunctivae normal.     Pupils: Pupils are equal, round, and reactive to light.  Neck:     Thyroid: No thyromegaly.     Vascular: No JVD.  Cardiovascular:     Rate and Rhythm: Normal rate and regular rhythm.     Heart sounds: Normal heart sounds. No murmur heard.    No friction rub. No gallop.  Pulmonary:     Effort: Pulmonary effort is normal. No respiratory distress.     Breath sounds: Normal breath sounds. No wheezing or rales.  Abdominal:     General: Bowel sounds are normal. There is no distension.     Palpations: Abdomen is  soft. There is no mass.     Tenderness: There is no abdominal tenderness.  Musculoskeletal:        General: No tenderness. Normal range of motion.     Cervical back: Normal range of motion and neck supple.  Lymphadenopathy:     Cervical: No cervical adenopathy.  Skin:    General: Skin is warm and dry.     Findings: No erythema or rash.  Neurological:     Mental Status: He is alert.     Coordination: Coordination normal.     Comments: The patient does need a little bit of help sitting up in the bed but is able to hold himself there, he answers questions appropriately, his strength in all 4 extremities is normal and there is no pronator drift, he is able to do finger-nose-finger without difficulty and he answers all of my questions with the correct answers across a broad variety of questions including family history his prior job his current relationships the pets he has at the house etc.  Psychiatric:        Behavior: Behavior normal.     (all labs ordered are listed, but only abnormal results are displayed) Labs Reviewed  URINALYSIS, W/ REFLEX TO CULTURE (INFECTION SUSPECTED) - Abnormal; Notable for the following components:      Result Value   APPearance HAZY (*)    Hgb urine dipstick SMALL (*)    Protein, ur 100 (*)    Leukocytes,Ua MODERATE (*)    Bacteria, UA MANY (*)    All other components within normal limits  COMPREHENSIVE METABOLIC PANEL  WITH GFR - Abnormal; Notable for the following components:   Glucose, Bld 115 (*)    Calcium  8.6 (*)    All other components within normal limits  CBC WITH DIFFERENTIAL/PLATELET - Abnormal; Notable for the following components:   WBC 11.1 (*)    Neutro Abs 9.4 (*)    Lymphs Abs 0.6 (*)    All other components within normal limits  RESP PANEL BY RT-PCR (RSV, FLU A&B, COVID)  [id number]  URINE CULTURE    EKG: EKG Interpretation Date/Time:  Saturday November 24 2023 11:49:06 EST Ventricular Rate:  63 PR Interval:  164 QRS Duration:  116 QT Interval:  408 QTC Calculation: 418 R Axis:   131  Text Interpretation: Sinus rhythm Nonspecific intraventricular conduction delay Nonspecific T abnormalities, lateral leads since last tracing no significant change Confirmed by Cleotilde Rogue ([id number]) on 11/24/2023 12:00:42 PM  Radiology: [facility] Chest Port 1 View Result Date: 11/24/2023 EXAM: 1 VIEW(S) XRAY OF THE CHEST 11/24/2023 11:59:40 AM COMPARISON: No significant change since previous study dated 07/18/2020 . CLINICAL HISTORY: Fever, altered. Innocent and shaking while standing. Headache. Nodule. FINDINGS: LINES, TUBES AND DEVICES: Cardiac pacemaker. LUNGS AND PLEURA: Shallow inspiration. Lungs are clear. No pleural effusion. No pneumothorax. HEART AND MEDIASTINUM: Heart size and pulmonary vascularity are normal. Mediastinal contours appear intact. Calcification of the aorta. Cardiac pacemaker. BONES AND SOFT TISSUES: Degenerative changes in the spine and shoulders. No acute osseous abnormality. IMPRESSION: 1. No acute cardiopulmonary process. Electronically signed by: Elsie Gravely MD 11/24/2023 12:04 PM EST RP Workstation: [id number]     Procedures   Medications Ordered in the ED  ondansetron  (ZOFRAN ) injection 4 mg (4 mg Intravenous Given 11/24/23 1158)  acetaminophen  (TYLENOL ) tablet 1,000 mg (1,000 mg Oral Given 11/24/23 1158)  cefTRIAXone  (ROCEPHIN ) 1 g in sodium chloride  0.9 % 100 mL IVPB (0  g Intravenous Stopped 11/24/23 1259)  Clinical Course as of 11/24/23 1307  Sat Nov 24, 2023  1303 Pt I nformed of UTI - tx started - family at beside willing to help at home - VS normal other than low grade fever - stable appearing for d/c [BM]    Clinical Course User Index [BM] Cleotilde Rogue, MD                                 Medical Decision Making Amount and/or Complexity of Data Reviewed Labs: ordered. Radiology: ordered. ECG/medicine tests: ordered.  Risk OTC drugs. Prescription drug management.   I cannot induce any nystagmus however when the patient sits up he becomes acutely vertiginous, when he lays down it goes away in short order.  He has no focal neurologic deficits, he does have a temperature of 100.9 which may be the reason that he is feeling shaky.  Will obtain some labs and testing to make sure he does not have any treatable illnesses however he does not appear septic, he is not tachycardic he is not hypotensive, he has no hypoxia and nothing on exam that is concerning other than inducible vertigo suggestive of an inner ear issue  Labs:  I  personally viewed and interpreted the labs which show UTI - with lots of bacteria and WBC.      Final diagnoses:  Acute cystitis without hematuria    ED Discharge Orders          Ordered    cephALEXin  (KEFLEX ) 500 MG capsule  3 times daily        11/24/23 1305               Cleotilde Rogue, MD 11/24/23 [phone]

## 2023-11-26 LAB — URINE CULTURE: Culture: >=100000 — AB

## 2023-11-27 ENCOUNTER — Telehealth (HOSPITAL_BASED_OUTPATIENT_CLINIC_OR_DEPARTMENT_OTHER): Payer: Self-pay | Admitting: *Deleted

## 2023-11-27 NOTE — Telephone Encounter (Signed)
 Post ED Visit - Positive Culture Follow-up  Culture report reviewed by antimicrobial stewardship pharmacist: [facility] [x]  [facility] Pharm.D. []  Venetia Gully, [address].D., BCPS AQ-ID []  Garrel Crews, Pharm.D., BCPS []  Almarie Lunger, Pharm.D., BCPS []  [place], [address].D., BCPS, AAHIVP []  Rosaline Bihari, Pharm.D., BCPS, AAHIVP []  Vernell Meier, PharmD, BCPS []  Latanya Hint, PharmD, BCPS []  Donald Medley, PharmD, BCPS []  Rocky Bold, PharmD []  Dorothyann Alert, PharmD, BCPS []  Morene Babe, PharmD  [facility] []  Rosaline Edison, PharmD []  Romona Bliss, PharmD []  Dolphus Roller, PharmD []  Veva Seip, Rph []  Vernell Daunt) [name], PharmD []  Eva Allis, PharmD []  Rosaline Millet, PharmD []  Iantha Batch, PharmD []  Arvin Gauss, PharmD []  Wanda Hasting, PharmD []  Ronal Rav, PharmD []  Rocky Slade, PharmD []  Bard Jeans, PharmD   Positive urine culture Treated with Cephalexin , organism sensitive to the same and no further patient follow-up is required at this time.  Marcus Patterson 11/27/2023, 5:26 PM
